# Patient Record
Sex: Male | Born: 1940 | Hispanic: No | Marital: Married
Health system: Northeastern US, Academic
[De-identification: ages and names within clinical notes are randomized; demographics above are authoritative.]

---

## 2019-07-15 IMAGING — CT CT CHEST WITH UROGRAM
3 of 9 series · 11 of 46 positions shown, 17 images · IV contrast (isovue)
Comparison: Comparison was made to the prior exam(s) within the last 12 months 
dated  02/22/2019 and 04/12/2019

******** ADDENDUM #1 ********/n 
ADDED CT CHEST TECHNIQUE: The chest was scanned from base of neck through the 
lung bases with 855cc of Isovue 300 injected intravenously on a high resolution 
low dose CT scanner.  Routine MPR and MIP 3D renderings were reconstructed on an 
independent workstation with concurrent physician supervision. 
REPORT ******** 
CT CHEST WITH UROGRAM, 07/15/2019 [DATE]: 
CLINICAL INDICATION: Bladder carcinoma. History of prostate carcinoma 1229. 
A search for DICOM formatted images was conducted for prior CT imaging studies 
completed at a non-affiliated media free facility.
TECHNIQUE: The abdomen and pelvis were scanned from lung bases through the 
pubic rami without and with 955cc's of Isovue 300 contrast injected 
intravenously on a high-resolution CT scanner using dose reduction techniques. 
Routine MPR and MIP 3D renderings were reconstructed on an independent 
workstation with concurrent physician supervision.

[Series 8: coronal · coronal · 0.65mm/px · 2 of 141 slices shown, 3 images]
[im 47/141  soft-tissue]
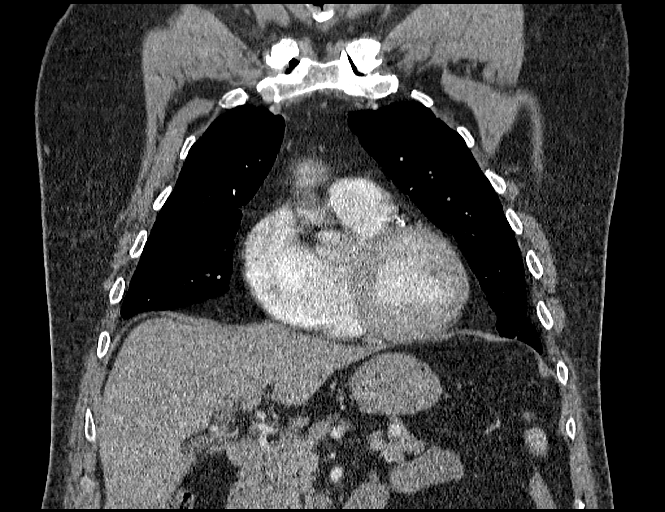
[im 47/141  bone]
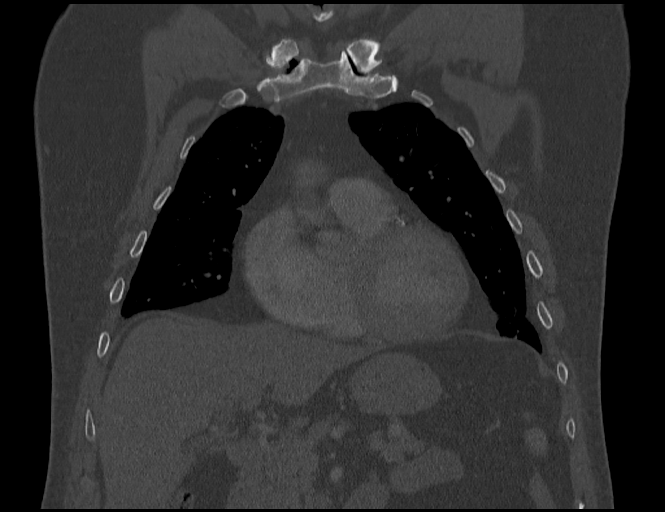
[im 94/141  soft-tissue]
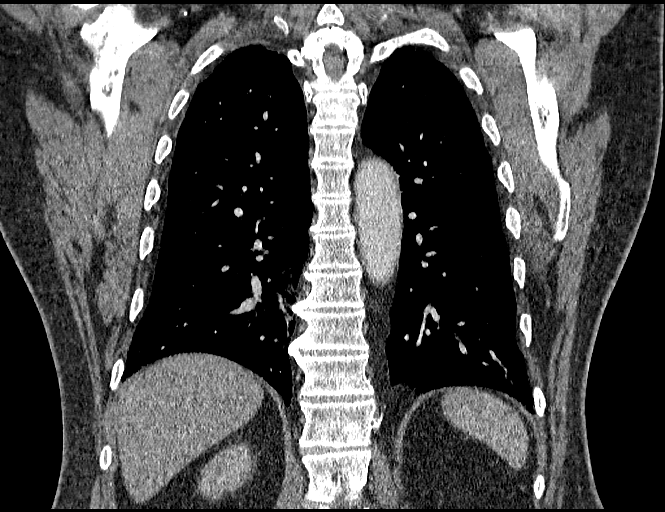

[Series 10: portal 3.0 i41s 2 · axial · portal-venous · 0.96mm/px · z∈[-593,-200]mm · 7 of 175 slices shown, 12 images]
[im 22/175  soft-tissue]
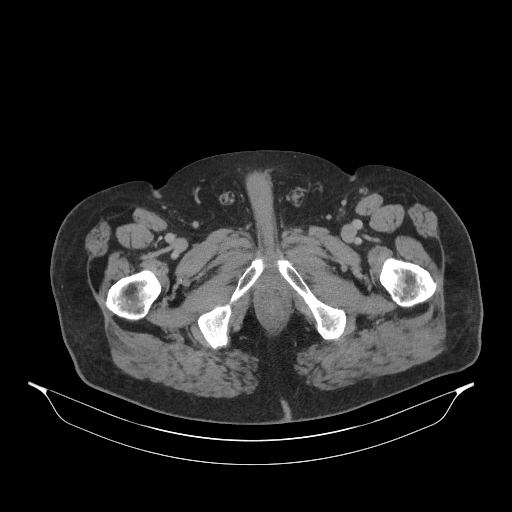
[im 22/175  bone]
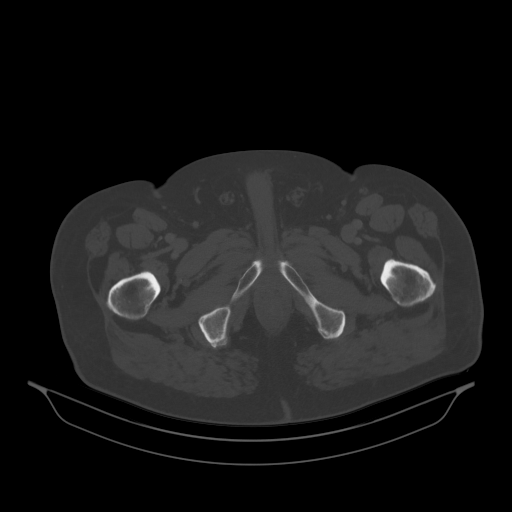
[im 44/175  soft-tissue]
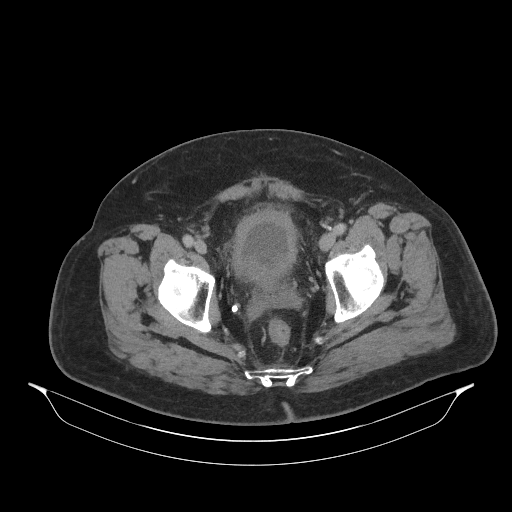
[im 66/175  soft-tissue]
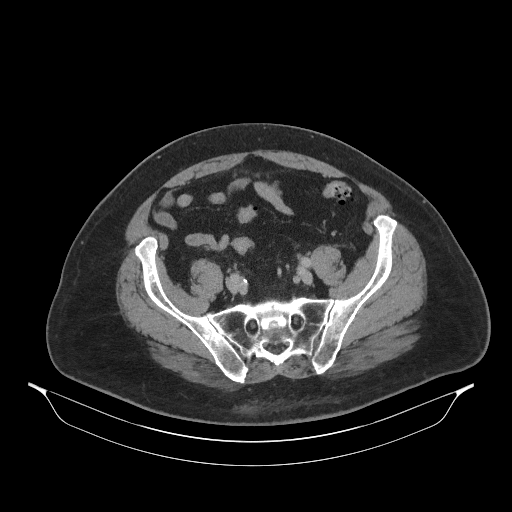
[im 88/175  soft-tissue]
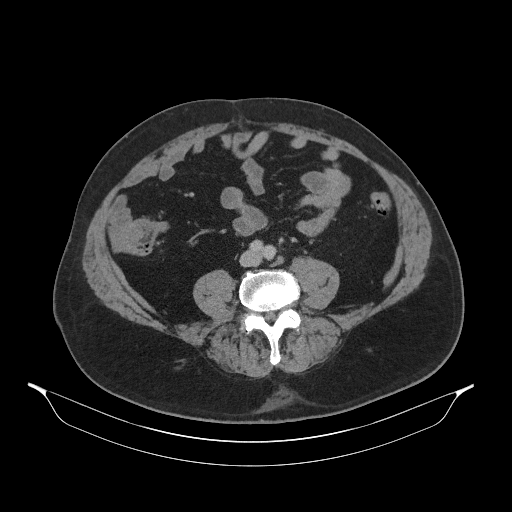
[im 88/175  lung]
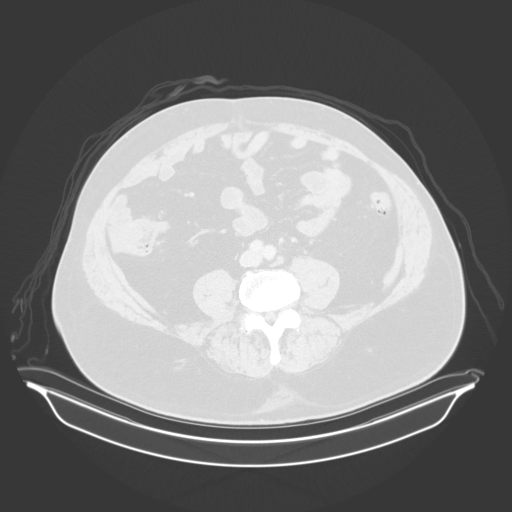
[im 109/175  soft-tissue]
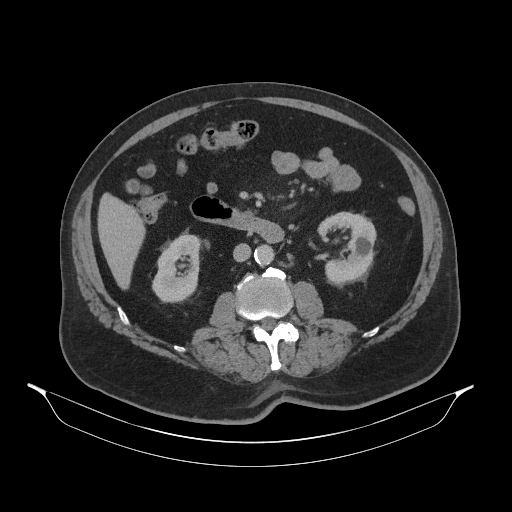
[im 109/175  lung]
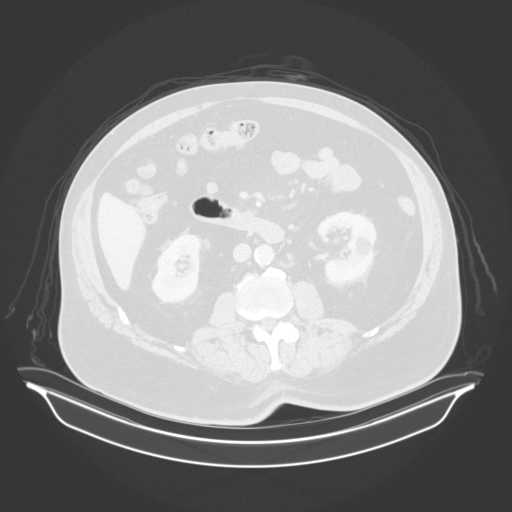
[im 131/175  soft-tissue]
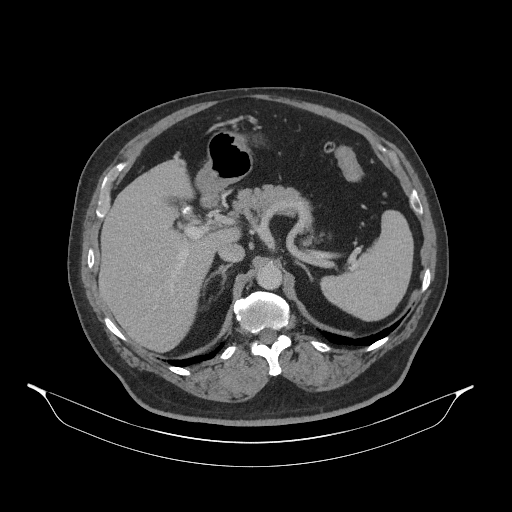
[im 131/175  lung]
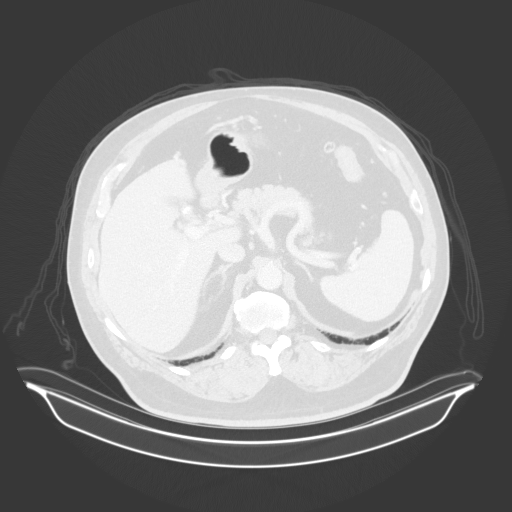
[im 153/175  soft-tissue]
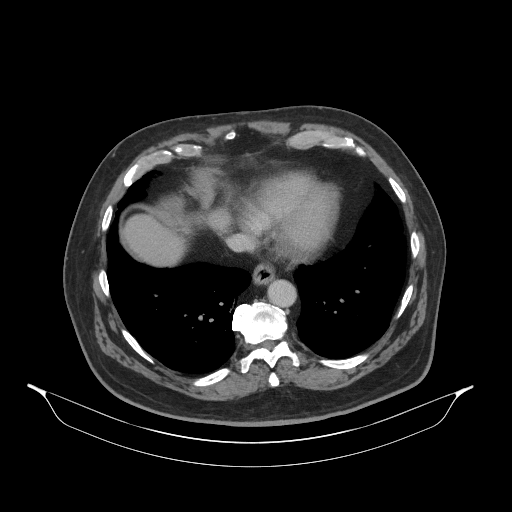
[im 153/175  lung]
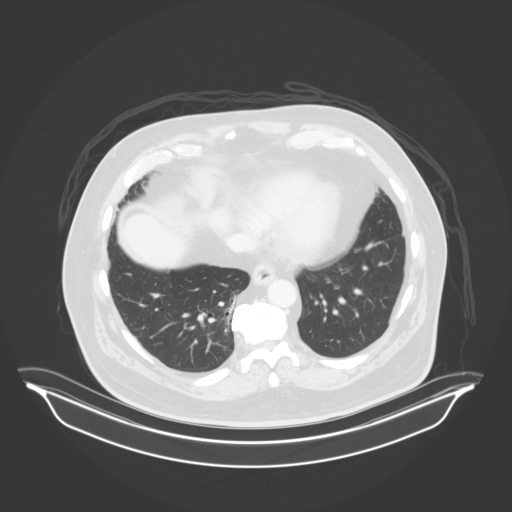

[Series 13: delay 3.0 i41s 2 · axial · delayed · 0.96mm/px · z∈[-593,-527]mm · 2 of 175 slices shown]
[im 22/175  soft-tissue]
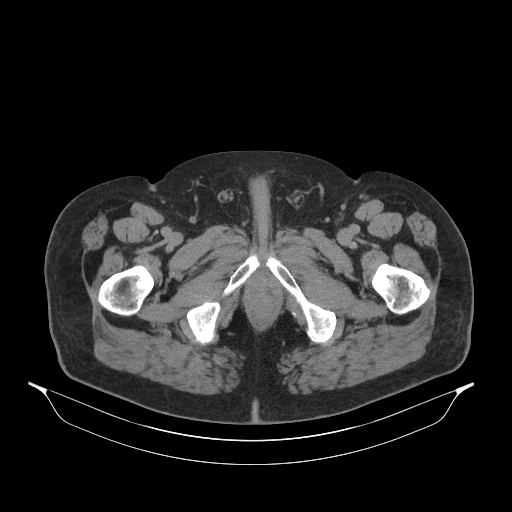
[im 44/175  soft-tissue]
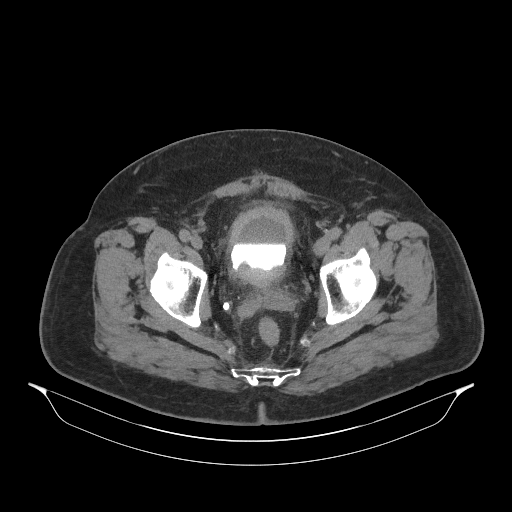

[11 of 46 positions shown; findings below may reference images not displayed]

FINDINGS: Atherosclerotic changes with marked coronary calcifications. 
Left-sided PICC line. Degenerative changes. No mediastinal mass or adenopathy. 
Pleural-based nodule along the major fissure best seen on image 85 actually 
decreased in conspicuity. Previously 12 mm currently 7 mm could represent 
incidental atelectasis or scarring actually improving as stated above. 
Osteophytosis within the lower thoracic spine with secondary adjacent fibrosis 
in the medial aspect right lower lobe similar to the prior exam. No pleural 
effusion. No new mass. Small hiatal hernia. Stable appearance of the chest. 
The liver, spleen, pancreas, adrenal glands and kidneys are normal in 
appearance. Stable nonobstructing 3 mm left renal calculus and simple cyst seen 
bilaterally. The gallbladder is contracted around numerous gallstones. No 
adenopathy. No free fluid seen within the abdomen. The retroperitoneal and 
pelvic lymph nodes described previously are not pathologically enlarged and are 
stable in appearance. Diverticulosis. Diffuse bladder wall thickening is once 
again identified with surrounding pericystic edema unchanged. Enlarged prostate 
gland elevates the bladder base. No osseous lesion seen.
IMPRESSION: Overall very stable appearance of the chest, abdomen and pelvis compared to 
prior exams. No new suspicious abnormality seen. 
RADIATION DOSE REDUCTION: All CT scans are performed using radiation dose 
reduction techniques, when applicable.  Technical factors are evaluated and 
adjusted to ensure appropriate moderation of exposure.  Automated dose 
management technology is applied to adjust the radiation doses to minimize 
exposure while achieving diagnostic quality images.

## 2020-05-15 ENCOUNTER — Inpatient Hospital Stay: Admit: 2020-05-15 | Discharge: 2020-05-19 | Payer: MEDICARE | Admitting: Surgery

## 2020-05-15 ENCOUNTER — Encounter: Admit: 2020-05-15 | Payer: PRIVATE HEALTH INSURANCE

## 2020-05-15 LAB — CBC WITH AUTO DIFFERENTIAL
BKR BLOOD UREA NITROGEN: 89.5 fL (ref 80.0–100.0)
BKR WAM ABSOLUTE IMMATURE GRANULOCYTES.: 0.02 x 1000/??L (ref 0.00–0.30)
BKR WAM ABSOLUTE LYMPHOCYTE COUNT.: 0.3 x 1000/??L — ABNORMAL LOW (ref 0.60–3.70)
BKR WAM ABSOLUTE NRBC (2 DEC): 0 x 1000/??L (ref 0.00–1.00)
BKR WAM ANALYZER ANC: 6.85 x 1000/??L (ref 2.00–7.60)
BKR WAM BASOPHIL ABSOLUTE COUNT.: 0.01 x 1000/??L (ref 0.00–1.00)
BKR WAM BASOPHILS: 0.1 % (ref 0.0–1.4)
BKR WAM EOSINOPHIL ABSOLUTE COUNT.: 0 x 1000/??L (ref 0.00–1.00)
BKR WAM EOSINOPHILS: 0 % (ref 0.0–5.0)
BKR WAM HEMATOCRIT (2 DEC): 23.1 % — ABNORMAL LOW (ref 38.50–50.00)
BKR WAM HEMOGLOBIN: 7.2 g/dL — ABNORMAL LOW (ref 13.2–17.1)
BKR WAM IMMATURE GRANULOCYTES: 0.3 % (ref 0.0–1.0)
BKR WAM LYMPHOCYTES: 3.8 % — ABNORMAL LOW (ref 17.0–50.0)
BKR WAM MCH (PG): 27.9 pg (ref 27.0–33.0)
BKR WAM MCHC: 31.2 g/dL — ABNORMAL LOW (ref 31.0–36.0)
BKR WAM MCV: 89.5 fL (ref 80.0–100.0)
BKR WAM MONOCYTE ABSOLUTE COUNT.: 0.78 x 1000/??L (ref 0.00–1.00)
BKR WAM MONOCYTES: 9.8 % (ref 4.0–12.0)
BKR WAM MPV: 9.1 fL (ref 8.0–12.0)
BKR WAM NEUTROPHILS: 86 % — ABNORMAL HIGH (ref 39.0–72.0)
BKR WAM NUCLEATED RED BLOOD CELLS: 0 % (ref 0.0–1.0)
BKR WAM PLATELETS: 212 x1000/??L (ref 150–420)
BKR WAM RED BLOOD CELL COUNT.: 2.58 M/ÂµL — ABNORMAL LOW (ref 4.00–6.00)
BKR WAM WHITE BLOOD CELL COUNT: 8 x1000/??L (ref 4.0–11.0)

## 2020-05-15 LAB — PROTIME AND INR
BKR INR: 1.06 (ref 0.87–1.14)
BKR PROTHROMBIN TIME: 11.5 seconds (ref 9.6–12.3)

## 2020-05-15 MED ORDER — ACETAMINOPHEN 325 MG TABLET
325 mg | Freq: Once | ORAL | Status: DC
Start: 2020-05-15 — End: 2020-05-16

## 2020-05-15 MED ORDER — LOSARTAN 50 MG TABLET
50 mg | Freq: Every day | ORAL | Status: DC
Start: 2020-05-15 — End: 2020-05-16

## 2020-05-15 MED ORDER — INSULIN LISPRO 100 UNIT/ML (SLIDING SCALE)
100 unit/mL | Freq: Every evening | SUBCUTANEOUS | Status: DC
Start: 2020-05-15 — End: 2020-05-19

## 2020-05-15 MED ORDER — DEXTROSE 50 % IN WATER (D50W) INTRAVENOUS SYRINGE
INTRAVENOUS | Status: DC | PRN
Start: 2020-05-15 — End: 2020-05-19

## 2020-05-15 MED ORDER — SKIM MILK
ORAL | Status: DC | PRN
Start: 2020-05-15 — End: 2020-05-19

## 2020-05-15 MED ORDER — FRUIT JUICE
ORAL | Status: DC | PRN
Start: 2020-05-15 — End: 2020-05-19

## 2020-05-15 MED ORDER — GLUCAGON 1 MG/ML IN STERILE WATER
Freq: Once | INTRAMUSCULAR | Status: DC | PRN
Start: 2020-05-15 — End: 2020-05-19

## 2020-05-15 MED ORDER — DEXTROSE 40 % ORAL GEL
40 % | ORAL | Status: DC | PRN
Start: 2020-05-15 — End: 2020-05-19

## 2020-05-15 MED ORDER — OXYCODONE IMMEDIATE RELEASE 5 MG TABLET
5 mg | ORAL | Status: DC | PRN
Start: 2020-05-15 — End: 2020-05-19
  Administered 2020-05-16: 01:00:00 5 mg via ORAL

## 2020-05-15 MED ORDER — INSULIN LISPRO 100 UNIT/ML (SLIDING SCALE)
100 unit/mL | Freq: Three times a day (TID) | SUBCUTANEOUS | Status: DC
Start: 2020-05-15 — End: 2020-05-19
  Administered 2020-05-18: 18:00:00 100 mL via SUBCUTANEOUS

## 2020-05-15 MED ORDER — ROSUVASTATIN 20 MG TABLET
20 mg | Freq: Every day | ORAL | Status: DC
Start: 2020-05-15 — End: 2020-05-19
  Administered 2020-05-16 – 2020-05-19 (×4): 20 mg via ORAL

## 2020-05-15 MED ORDER — POLYETHYLENE GLYCOL 3350 17 GRAM ORAL POWDER PACKET
17 gram | Freq: Every day | ORAL | Status: DC
Start: 2020-05-15 — End: 2020-05-19
  Administered 2020-05-17 – 2020-05-18 (×2): 17 gram via ORAL

## 2020-05-15 MED ORDER — LIDOCAINE 2 % MUCOSAL JELLY IN APPLICATOR
2 % | Freq: Four times a day (QID) | URETHRAL | Status: DC | PRN
Start: 2020-05-15 — End: 2020-05-19

## 2020-05-15 MED ORDER — AMLODIPINE 10 MG TABLET
10 mg | Freq: Every day | ORAL | Status: DC
Start: 2020-05-15 — End: 2020-05-16

## 2020-05-15 MED ORDER — ACETAMINOPHEN 325 MG TABLET
325 mg | Freq: Four times a day (QID) | ORAL | Status: DC
Start: 2020-05-15 — End: 2020-05-19
  Administered 2020-05-16 – 2020-05-19 (×13): 325 mg via ORAL

## 2020-05-15 MED ORDER — SODIUM CHLORIDE 0.9 % INTRAVENOUS SOLUTION
INTRAVENOUS | Status: DC
Start: 2020-05-15 — End: 2020-05-19
  Administered 2020-05-16 (×2): via INTRAVENOUS

## 2020-05-15 MED ORDER — HEPARIN (PORCINE) 5,000 UNIT/ML INJECTION SOLUTION
5000 unit/mL | Freq: Two times a day (BID) | SUBCUTANEOUS | Status: DC
Start: 2020-05-15 — End: 2020-05-19
  Administered 2020-05-16 – 2020-05-17 (×2): via SUBCUTANEOUS

## 2020-05-15 MED ORDER — SODIUM CHLORIDE 0.9 % (FLUSH) INJECTION SYRINGE
0.9 % | Freq: Three times a day (TID) | INTRAVENOUS | Status: DC
Start: 2020-05-15 — End: 2020-05-19
  Administered 2020-05-16 – 2020-05-18 (×2): 0.9 mL via INTRAVENOUS

## 2020-05-15 MED ORDER — OXYCODONE IMMEDIATE RELEASE 5 MG TABLET
5 mg | ORAL | Status: DC | PRN
Start: 2020-05-15 — End: 2020-05-19
  Administered 2020-05-18 (×3): 5 mg via ORAL

## 2020-05-15 MED ORDER — SODIUM CHLORIDE 0.9 % (FLUSH) INJECTION SYRINGE
0.9 % | INTRAVENOUS | Status: DC | PRN
Start: 2020-05-15 — End: 2020-05-19

## 2020-05-16 ENCOUNTER — Ambulatory Visit: Admit: 2020-05-16 | Payer: PRIVATE HEALTH INSURANCE

## 2020-05-16 DIAGNOSIS — R319 Hematuria, unspecified: Secondary | ICD-10-CM

## 2020-05-16 LAB — BASIC METABOLIC PANEL
BKR ANION GAP: 13 (ref 7–17)
BKR ANION GAP: 13 g/dL — ABNORMAL LOW (ref 7–17)
BKR BLOOD UREA NITROGEN: 19 mg/dL (ref 8–23)
BKR BUN / CREAT RATIO: 16.2 % — ABNORMAL HIGH (ref 8.0–23.0)
BKR BUN / CREAT RATIO: 19.3 (ref 8.0–23.0)
BKR CALCIUM: 8.2 mg/dL — ABNORMAL LOW (ref 8.8–10.2)
BKR CALCIUM: 8.4 mg/dL — ABNORMAL LOW (ref 8.8–10.2)
BKR CHLORIDE: 103 mmol/L (ref 98–107)
BKR CHLORIDE: 99 mmol/L (ref 98–107)
BKR CO2: 19 mmol/L — ABNORMAL LOW (ref 20–30)
BKR CO2: 20 mmol/L (ref 20–30)
BKR CREATININE: 1.17 mg/dL (ref 0.40–1.30)
BKR CREATININE: 1.19 mg/dL (ref 0.40–1.30)
BKR EGFR (AFR AMER): >60 mL/min/{1.73_m2} (ref >60)
BKR EGFR (AFR AMER): >60 mL/min/{1.73_m2} (ref >60)
BKR EGFR (NON AFRICAN AMERICAN): 59 mL/min/{1.73_m2} (ref >60)
BKR GLUCOSE: 128 mg/dL — ABNORMAL HIGH (ref 70–100)
BKR GLUCOSE: 53 mg/dL — ABNORMAL LOW (ref 70–100)
BKR POTASSIUM: 3.9 mmol/L (ref 3.3–5.3)
BKR POTASSIUM: 4.3 mmol/L (ref 3.3–5.3)
BKR SODIUM: 131 mmol/L — ABNORMAL LOW (ref 136–144)
BKR SODIUM: 136 mmol/L (ref 136–144)
BKR WAM RDW-CV: 19.3 % — ABNORMAL HIGH (ref 8.0–23.0)

## 2020-05-16 LAB — CBC WITH AUTO DIFFERENTIAL
BKR EGFR (NON AFRICAN AMERICAN): 9.2 fL (ref 8.0–12.0)
BKR WAM ABSOLUTE IMMATURE GRANULOCYTES.: 0.02 x 1000/??L (ref 0.00–0.30)
BKR WAM ABSOLUTE LYMPHOCYTE COUNT.: 0.41 x 1000/??L — ABNORMAL LOW (ref 0.60–3.70)
BKR WAM ABSOLUTE NRBC (2 DEC): 0 x 1000/??L (ref 0.00–1.00)
BKR WAM ANALYZER ANC: 5.44 x 1000/??L (ref 2.00–7.60)
BKR WAM BASOPHIL ABSOLUTE COUNT.: 0 x 1000/??L (ref 0.00–1.00)
BKR WAM BASOPHILS: 0 % (ref 0.0–1.4)
BKR WAM EOSINOPHIL ABSOLUTE COUNT.: 0.01 x 1000/??L (ref 0.00–1.00)
BKR WAM EOSINOPHILS: 0.2 % (ref 0.0–5.0)
BKR WAM HEMATOCRIT (2 DEC): 23.2 % — ABNORMAL LOW (ref 38.50–50.00)
BKR WAM HEMOGLOBIN: 7.1 g/dL — ABNORMAL LOW (ref 13.2–17.1)
BKR WAM IMMATURE GRANULOCYTES: 0.3 % (ref 0.0–1.0)
BKR WAM LYMPHOCYTES: 6.2 % — ABNORMAL LOW (ref 17.0–50.0)
BKR WAM MCH (PG): 27 pg (ref 27.0–33.0)
BKR WAM MCHC: 30.6 g/dL — ABNORMAL LOW (ref 31.0–36.0)
BKR WAM MCV: 88.2 fL (ref 80.0–100.0)
BKR WAM MONOCYTE ABSOLUTE COUNT.: 0.69 x 1000/??L (ref 0.00–1.00)
BKR WAM MONOCYTES: 10.5 % (ref 4.0–12.0)
BKR WAM MPV: 9.2 fL (ref 8.0–12.0)
BKR WAM NEUTROPHILS: 82.8 % — ABNORMAL HIGH (ref 39.0–72.0)
BKR WAM NUCLEATED RED BLOOD CELLS: 0 % (ref 0.0–1.0)
BKR WAM PLATELETS: 218 x1000/??L (ref 150–420)
BKR WAM RDW-CV: 17.2 % — ABNORMAL HIGH (ref 11.0–15.0)
BKR WAM RED BLOOD CELL COUNT.: 2.63 M/??L — ABNORMAL LOW (ref 4.00–6.00)
BKR WAM WHITE BLOOD CELL COUNT: 6.6 x1000/ÂµL (ref 4.0–11.0)

## 2020-05-16 LAB — RETICULOCYTES
BKR WAM IRF: 29.8 % — ABNORMAL HIGH (ref 3.0–15.9)
BKR WAM RETICULOCYTE - ABS (3 DEC): 0.051 10??6 cells/uL (ref 0.023–0.140)
BKR WAM RETICULOCYTE COUNT PCT (1 DEC): 2 % (ref 0.6–2.7)
BKR WAM RETICULOCYTE HGB EQUIVALENT: 20 pg — ABNORMAL LOW (ref 28.2–35.7)

## 2020-05-16 LAB — HEMOGLOBIN AND HEMATOCRIT, BLOOD
BKR WAM HEMATOCRIT (2 DEC): 28.5 % — ABNORMAL LOW (ref 38.50–50.00)
BKR WAM HEMOGLOBIN: 9.5 g/dL — ABNORMAL LOW (ref 13.2–17.1)

## 2020-05-16 LAB — VITAMIN B12: BKR VITAMIN B12: 373 pg/mL (ref 232–1245)

## 2020-05-16 LAB — SARS COV-2 (COVID-19) RNA-~~LOC~~ LABS (BH GH LMW YH): BKR SARS-COV-2 RNA (COVID-19) (YH): NEGATIVE

## 2020-05-16 MED ORDER — IOHEXOL 350 MG IODINE/ML INTRAVENOUS SOLUTION
350 mg iodine/mL | Freq: Once | INTRAVENOUS | Status: CP | PRN
Start: 2020-05-16 — End: ?
  Administered 2020-05-16: 17:00:00 350 mL via INTRAVENOUS

## 2020-05-16 MED ORDER — ALPRAZOLAM 1 MG TABLET
1 mg | Freq: Three times a day (TID) | ORAL | Status: AC | PRN
Start: 2020-05-16 — End: ?

## 2020-05-16 MED ORDER — DILTIAZEM IMMEDIATE RELEASE 30 MG TABLET
30 mg | Freq: Four times a day (QID) | ORAL | Status: DC
Start: 2020-05-16 — End: 2020-05-19
  Administered 2020-05-16 – 2020-05-19 (×11): 30 mg via ORAL

## 2020-05-16 MED ORDER — OXYBUTYNIN CHLORIDE ER 5 MG TABLET,EXTENDED RELEASE 24 HR
5 mg | Freq: Every evening | ORAL | Status: DC
Start: 2020-05-16 — End: 2020-05-17
  Administered 2020-05-17: 03:00:00 5 mg via ORAL

## 2020-05-16 MED ORDER — BELLADONNA ALKALOIDS-OPIUM 16.2 MG-30 MG RECTAL SUPPOSITORY
16.2-30 mg | Freq: Two times a day (BID) | RECTAL | Status: DC | PRN
Start: 2020-05-16 — End: 2020-05-19
  Administered 2020-05-18 – 2020-05-19 (×2): via RECTAL

## 2020-05-16 MED ORDER — FERROUS SULFATE 325 MG (65 MG IRON) TABLET
325 mg (65 mg iron) | Freq: Every day | ORAL | Status: AC
Start: 2020-05-16 — End: ?

## 2020-05-16 MED ORDER — DILTIAZEM IMMEDIATE RELEASE 30 MG TABLET
30 mg | Freq: Four times a day (QID) | ORAL | Status: AC
Start: 2020-05-16 — End: ?

## 2020-05-16 MED ORDER — SODIUM CHLORIDE 0.9 % BOLUS (NEW BAG)
0.9 % | Freq: Once | INTRAVENOUS | Status: CP | PRN
Start: 2020-05-16 — End: ?
  Administered 2020-05-16: 17:00:00 0.9 mL/h via INTRAVENOUS

## 2020-05-16 MED ORDER — TROSPIUM 20 MG TABLET
20 mg | Freq: Two times a day (BID) | ORAL | Status: AC
Start: 2020-05-16 — End: ?

## 2020-05-16 MED ORDER — SENNOSIDES 8.6 MG-DOCUSATE SODIUM 50 MG TABLET
Freq: Every day | ORAL | Status: AC
Start: 2020-05-16 — End: ?

## 2020-05-16 MED ORDER — PANTOPRAZOLE 40 MG TABLET,DELAYED RELEASE
40 mg | Freq: Every day | ORAL | Status: DC
Start: 2020-05-16 — End: 2020-05-19
  Administered 2020-05-16 – 2020-05-19 (×4): 40 mg via ORAL

## 2020-05-16 MED ORDER — POLYETHYLENE GLYCOL 3350 17 GRAM ORAL POWDER PACKET
17 gram | Freq: Every day | ORAL | Status: AC
Start: 2020-05-16 — End: ?

## 2020-05-16 MED ORDER — OXYCODONE IMMEDIATE RELEASE 5 MG TABLET
5 mg | Freq: Four times a day (QID) | ORAL | Status: AC | PRN
Start: 2020-05-16 — End: ?

## 2020-05-16 MED ORDER — AMLODIPINE 5 MG TABLET
5 mg | Freq: Every day | ORAL | Status: DC
Start: 2020-05-16 — End: 2020-05-19
  Administered 2020-05-16 – 2020-05-19 (×4): 5 mg via ORAL

## 2020-05-16 MED ORDER — OXYBUTYNIN CHLORIDE ER 15 MG TABLET,EXTENDED RELEASE 24 HR
15 mg | Freq: Every day | ORAL | Status: AC
Start: 2020-05-16 — End: ?

## 2020-05-16 NOTE — Other
Urologic Surgery Consult NoteReason For Consult: gross hematuriaRequesting Attending: Cruz Condon, MDUrology Attending: Dr. Casimiro Needle SievHPI:  Max Snyder is a 79 y.o. male w/ PMH of atrial fibrillation (previously on Eliquis, held), CAD s/p stents, recent DVT/pulmonary embolism (s/p IVC filter placement on 12/9), T2DM, HTN, Lyme disease, CVA, EtOH use disorder and Stage 2 NMIBC (prostatic involvement) followed by Dr. Carles Collet and Dr. Vassie Loll s/p TURBTs and radiation therapy complicated by refractory radiation cystitis s/p hyperbaric oxygen, multiple clot evacuations/fulgurations, intravesical therapies (Amicar, Formalin 2%) and now with s/p bilateral nephrostomy tube placement on 04/19/20 with ongoing hematuria from his nephrostomy tubes and bladder. He was most recently discharged from [facility] on 12/21 to home with frequent monitoring of his blood counts after extensive goals of care discussion involving palliative care. He represented to [facility] ED with worsening hematuria from his nephrostomy tubes and critically low hemoglobin/hematocrit requiring blood transfusions. He was transferred to [facility] for further evaluation. On evaluation, the patient is afebrile and hemodynamically stable. The patient's labs are notable for anemia, Hgb 7.2 (s/p 2u pRBC transfusion at OSH, per report). Prior to transfer from [facility] ED, he was reported to be tachycardic to 110-120s. He was transferred 2U of packed red blood cells at outside hospital. He reports that he was discharged home but had continued bleeding of clots from his meatus with associated discomfort and bleeding within the nephrostomy tubes. He had serial lab evaluations and was instructed to present to the ED for possible blood transfusion. He denies producing any urine from his bladder other than passing clots. He denies fevers or chills, endorses an episode of nausea and vomiting. He denies bilateral flank pain. He denies chest pain or lightheadedness but endorses shortness of breath. He has good understanding of his disease and is aware that the nephrostomy tubes are permanent in order to divert urine and allow his bladder to clot off. He also reports a prior renal angiogram to evaluate for source of hematuria from his nephrostomy tubes which was negative per report. He also reports an intra-operative cystogram which demonstrated left sided reflux and that his ureters were purposefully scarred off. Per review of available information on Care Everywhere, the patient was most recently admitted to [facility] (12/1 to 12/21) with medically refractory gross hematuria. Prior to this admission, he has undergone hyperbaric oxygen, Amicar instillations and 2% formalin instillations on 03/02/20. He previously met with Dr. Francoise Schaumann due to TCC invasion into prostatic stroma where he was reluctant to consider radical cystectomy. Urology was consulted while he was admitted and he underwent multiple interventions including clot evacuations, intravesical therapies and ultimately bilateral nephrostomy tube placement with worsening hematuria from his nephrostomy tubes s/p interval upsizing of his nephrostomy tubes most recently on December 16th. He was also found to have a subsegmental PE with DVT ultrasound demonstrating bilateral below the knee DVTs and underwent IVC filter placement on 12/9 given concern for bleeding on anticoagulation.  His hospitalization was also notable for atrial fibrillation with RVR for which he was rate controlled with diltiazem. Palliative care was involved in his goals of care decision making for medically refractory gross hematuria. Based on goals of care discussions, he was ultimately discharged to home with frequent monitoring of his blood counts. ?PMH PSH Past Medical History: Diagnosis Date ? 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 Bladder cancer (HC Code)  ? Diabetes mellitus (HC Code) Type 2 ? Hypertension  ? Kidney stone  ? Lyme disease  ? Stroke [facility] Code) 2004  No past surgical history on file. Social History Family History Social History Tobacco Use ? Smoking status: Never Smoker ? Smokeless tobacco: Never Used Substance Use Topics ? Alcohol use: Yes  No family history on file. Current Medications No current facility-administered medications for this encounter.Current Outpatient Medications: ?  amLODIPine (NORVASC) 10 mg tablet, , Disp: , Rfl: ?  aspirin 81 mg EC delayed release tablet, Take 81 mg by mouth., Disp: , Rfl: ?  atorvastatin (LIPITOR) 80 mg tablet, atorvastatin 80 mg tablet, Disp: , Rfl: ?  glimepiride (AMARYL) 2 mg tablet, , Disp: , Rfl: ?  losartan (COZAAR) 100 mg tablet, , Disp: , Rfl: ?  pantoprazole (PROTONIX) 40 mg tablet, , Disp: , Rfl: ?  tadalafiL (CIALIS) 5 mg tablet, daily., Disp: , Rfl:   Allergies Lisinopril and Sulfa (sulfonamide antibiotics)  Review of Systems: A 10 point review of system was conducted that was negative unless noted in the HPI. Objective: Vitals: Temp:  [97.8 ?F (36.6 ?C)] 97.8 ?F (36.6 ?C)Pulse:  [100] 100BP: (121)/(65) 121/65SpO2:  [96 %] 96 %Device (Oxygen Therapy): room airIntake/Output: No intake/output data recorded. PHYSICAL EXAMPatient exam or treatment required medical chaperone.The sensitive parts of the examination were performed with chaperone present: Yes; Chaperone Name, Role/Title: Dr. Sharon Seller Gen: well appearing, resting in bed in no acute distressCV: regular ratePulm: comfortable on room airAbd: soft, protuberant, non-tender to palpation. Bilateral nephrostomy tubes draining yellow urine with debriGU: small dried old clots per urethral meatus visualized in incontinence diaper  Ext: warm, well perfused LABS/RADIOLOGY:CBCLab Results Component Value Date  WBC 8.0 05/15/2020  HGB 7.2 (L) 05/15/2020  HCT 23.10 (L) 05/15/2020  PLT 212 05/15/2020 ELECTROLYTESNo results found for: NA, K, CL, CO2, CREATININE, BUN, GLU, CALCIUM, MG, PHOSURINALYSISNo results found for: UA, LABURINIMAGINGNo results found.Assessment/Recommendations  Max Snyder is a 79 y.o. male, with multiple medical co-morbidities including but not limited to atrial fibrillation with RVR (previously on Eliquis currently held), CAD s/p stents and recent DVT/PE (s/p IVC filter placement on 12/9) and stage T2 NMIBC (prostatic involvement) bladder cancer s/p TURBTs and radiation therapy complicated by refractory radiation cystitis s/p HBO, multiple clot evacuations/fulgurations, intravesical therapies (Amicar, Formalin 2%) and now s/p bilateral nephrostomy tube placement on 12/2 with ongoing hematuria from his nephrostomy tubes and bladder. He was most recently discharged from [facility] on 12/21 to home with frequent monitoring of his blood counts after extensive goals of care discussion involving palliative care. He represented to [facility] ED with worsening hematuria from his nephrostomy tubes and critically low hemoglobin/hematocrit requiring blood transfusions. On physical examination, both of his nephrostomy tubes draining yellow urine with debris along with small clots per his urethral meatus. Plan is to maintain maximal urinary diversion with bilateral nephrostomy tubes to allow bladder to clot off and self-tamponade any lower tract bleeding. Admit to Urology under Dr. Santa Genera Acute GU InterventionCan consider increased formalin concentration instillation on an outpatient basisBilateral nephrostomy tubes to gravity drainageLidocaine jelly to urethral meatus PRN for discomfort Monitor urine outputTrend H+H, transfuse PRNDiscussed with Dr. Luther Parody:Chrystine Oiler, MD // Lucretia Kern, MDUrology ResidentConsult Pager: [phone] from 6am-6pm. Amion for nights/weekends.

## 2020-05-16 NOTE — Consults
[facility] Interventional RadiologyConsultation Information Interventional Radiology consultation requested by: Max Snyder, MDReason for consultation: Patient with history of hemorrhagic cystitis after radiation of bladder cancer, bilateral nephrostomy tube placements, persistent hematuria despite bilateral ureteral occlusion devices, persistent bladder clots.Source of information: Patient, medical record, and consulting providerHistory of Present Illness Max Snyder is a 79 y.o. male with hemorrhagic cystitis s/p bladder cancer irradiation.Interventional radiology was requested to evaluate for possible bladder embolization to treat hemorrhagic cystitis. Past Medical History Past Medical History: Diagnosis Date ? 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 Bladder cancer (HC Code)  ? Diabetes mellitus (HC Code)   Type 2 ? Hypertension  ? Kidney stone  ? Lyme disease  ? Stroke [facility] Code) 2004  Past Surgical History No past surgical history on file. Family History No family history on file. Social History Social History Tobacco Use ? Smoking status: Never Smoker ? Smokeless tobacco: Never Used Substance Use Topics ? Alcohol use: Yes  He reports no history of drug use.  Inpatient Medications Outpatient Medications Current Facility-Administered Medications Medication Dose Route Frequency Provider Last Rate Last Admin ? acetaminophen (TYLENOL) tablet 975 mg  975 mg Oral Q6H Max Kern, MD   975 mg at 05/16/20 1246 ? amLODIPine (NORVASC) tablet 5 mg  5 mg Oral Daily Max Kaufmann, MD   5 mg at 05/16/20 1020 ? dilTIAZem (CARDIZEM) Immediate Release tablet 30 mg  30 mg Oral Q6H Max Kaufmann, MD   30 mg at 05/16/20 1246 ? heparin (porcine) injection 5,000 Units  5,000 Units Subcutaneous Q12H Max Kern, MD   5,000 Units at 05/16/20 0930 ? insulin lispro (Admelog, HumaLOG) Sliding Scale (See admin instructions for dose)   Subcutaneous TID AC Max, Judeth Cornfield, MD     ? insulin lispro (Admelog, HumaLOG) Sliding Scale (See admin instructions for dose)   Subcutaneous Nightly Max, Judeth Cornfield, MD     ? oxybutynin XL (DITROPAN-XL) 24 hr tablet 15 mg  15 mg Oral Nightly Snyder, Max Ushir, MD     ? pantoprazole (PROTONIX) EC tablet 40 mg  40 mg Oral Daily Max Kaufmann, MD   40 mg at 05/16/20 0931 ? polyethylene glycol (MIRALAX) packet 17 g  17 g Oral Daily Max, Judeth Cornfield, MD     ? rosuvastatin (CRESTOR) tablet 40 mg  40 mg Oral Daily Max Kern, MD   40 mg at 05/16/20 0931 ? sodium chloride 0.9 % flush 3 mL  3 mL IV Push Q8H Max, Judeth Cornfield, MD   3 mL at 05/15/20 2132 ? sodium chloride 100 mL/hr (05/16/20 1540) belladonna alkaloids-opium, dextrose (GLUCOSE) 40 % gel 15 g **OR** fruit juice **OR** skim milk, dextrose (GLUCOSE) 40 % gel 30 g **OR** fruit juice, dextrose 50% in water (D50W), dextrose 50% in water (D50W), glucagon, lidocaine uro-jet, oxyCODONE, oxyCODONE, sodium chloride Medications Prior to Admission Medication Sig Dispense Refill Last Dose ? ALPRAZolam (XANAX) 1 mg tablet Take 1 mg by mouth 3 (three) times daily as needed for anxiety.    ? amLODIPine (NORVASC) 10 mg tablet Take 10 mg by mouth daily.     ? atorvastatin (LIPITOR) 80 mg tablet Take 80 mg by mouth at bedtime.     ? dilTIAZem (CARDIZEM) 30 mg Immediate Release tablet Take 30 mg by mouth 4 (four) times daily.    ? ferrous sulfate (FEOSOL) 325 mg (65 mg iron) tablet Take 1 tablet by mouth daily.    ? glimepiride (AMARYL) 2 mg tablet Take 2 mg by mouth  every morning.     ? oxybutynin XL (DITROPAN XL) 15 mg 24 hr tablet Take 15 mg by mouth daily.    ? oxyCODONE (ROXICODONE) 5 mg Immediate Release tablet Take 5 mg by mouth every 6 (six) hours as needed.    ? pantoprazole (PROTONIX) 40 mg tablet Take 40 mg by mouth daily.     ? polyethylene glycol (MIRALAX) 17 gram packet Take 17 g by mouth daily. Mix in 8 ounces of water, juice, soda, coffee or tea prior to taking.    ? senna-docusate (SENNA-PLUS) 8.6-50 mg tablet Take 1 tablet by mouth daily.    ? tadalafiL (CIALIS) 5 mg tablet Take 5 mg by mouth daily.     ? trospium (SANCTURA) 20 mg tablet Take 20 mg by mouth 2 (two) times daily.    ? aspirin 81 mg EC delayed release tablet Take 81 mg by mouth. (Patient not taking: Reported on 05/16/2020)   Not Taking at Unknown time ? losartan (COZAAR) 100 mg tablet  (Patient not taking: Reported on 05/16/2020)   Not Taking at Unknown time  Allergies Allergies Allergen Reactions ? Lisinopril Other (See Comments) ? Sulfa (Sulfonamide Antibiotics) Other (See Comments)  Objective Data Physical ExamVitals:  05/16/20 1218 05/16/20 1253 05/16/20 1309 05/16/20 1538 BP: 128/68 126/70 119/68 (!) 151/68 Pulse: (!) 109 (!) 108 (!) 105 (!) 103 Resp: 20 18 18 20  Temp: 98.3 ?F (36.8 ?C) 98.1 ?F (36.7 ?C) 97.9 ?F (36.6 ?C) 98.1 ?F (36.7 ?C) TempSrc: Oral Oral Oral  SpO2: 99% 99% 98% 99% Weight:     Height:     Neuro: Awake and alert in no acute distressHeart: Warm, perfusedLungs: Comfortably conversantAbdomen: Non-distendedLaboratory ResultsChemistry:Recent Labs Lab 12/28/211800 12/29/210509 NA 131* 136 K 4.3 3.9 CL 99 103 CO2 19* 20 BUN 23 19 CREATININE 1.19 1.17 Complete Blood Count:Recent Labs Lab 12/28/211800 12/29/210509 WBC 8.0 6.6 HGB 7.2* 7.1* HCT 23.10* 23.20* PLT 212 218 Liver Function Tests:No results for input(s): AST, ALT, ALKPHOS, BILITOT in the last 168 hours.Coagulation Studies:Recent Labs Lab 12/28/211800 LABPROT 11.5 INR 1.06 Microbiology:No results for input(s): LABBLOO, LABURIN, LOWERRESPIRA in the last 168 hours.Pertinent ImagingAssessment and Plan AssessmentJames Snyder is a 79 y.o. male with hemorrhagic cystitis s/p bladder cancer irradiation, persistent nephrostomy-tube bleeding despite bilateral ureteral occlusion devices, for which consultation for possible bladder embolization was requested.  After comprehensive evaluation of the patient and discussion with the requesting service, Interventional Radiology recommends discussion with patient for bladder embolization.PlanAfter extensive discussion of risks and benefits of bladder embolization, it was our opinion that the patient would benefit from this procedure. However, given the longstanding multiple treatments and interventions this patient has had, he is unsure about going through with this procedure given the risks of nontarget embolization and treatment failure.Will followup early AM tomorrow regarding opinion. Please page interventional radiology regarding patient's definitive decision if he decides. However, please proceed with COVID testing, appropriate INR (<1.5) platelets >50,000, and NPO at midnight as though procedure were being performed. Thank you for involving Interventional Radiology in the care of your patient. Please text or call my Mobile Heart Beat with any questions or concerns.With urgent questions or concerns, please contact IR IH:[id number] (pagers)- YSC: [phone]- SRC: [phone]- BH: [phone] (phone, all locations)- [phone] Noreene Larsson Meredith Pel, MD 05/16/2020

## 2020-05-16 NOTE — ED Notes
9:45 PM Floor Handoff Telemetry: 	[]  Yes		[x]  NoCode Status:   [x]  Full		[]  DNR		[]  DNI		Other (specify):Safety Precautions: [x]  None	[]  Sitter   []  Restraints	[]  Suicidal	[]  Fall Risk	Other (specify):Mentation/Orientation:	 A&O (Self, person, place, time) x  4        	 Disoriented to:                    	 Deficits: []  Hearing impaired	[]  Blind  []  Nonverbal	 []  Mental retardationOxygenation Upon Admission: [x]  RA	[]  NC	[]  Venti  []  Simple Mask []  Other	Baseline O2 Status? []  Yes	[]  NoAmbulation: []  Independent	[]  Cane   []  Walker	[]  Wheelchair	[]  Bedbound		[]  Hemiplegic	[]  Paraplegic	[]  QuadraplegicEliminiation: [x]  Independent	[]  Commode	[x]  Bedpan/Urinal  []  Straight Cath []  Foley cath			[]  Urostomy	[]  Colostomy	Other (specify): Diarrhea/Loose stool : []  1x within 24h  []  2x within 24h  []  3x within 24h  [x]  None 	C.Diff Order: 	[]  Ordered- needs to be collected             []  Collected-sent to lab             []  Resulted - Negative C.Diff             []  Resulted - Positive C.Diff[]  Not Ordered   [x]  N/ASkin Alteration: []  Pressure Injury []  Wound []  None [x]  Skin not assessedDiet: [x]  Regular/No order placed	[]  NPO		Other (specify):IV Access: [x]  PIV   []  PICC    []  Port    [] Central line    []  A-line    Other (specify)IVF/GTT Running Upon ED Departure? [x]  No	    []  Yes (specify):Outstanding Meds/Treatments/Tests:Patient Belongings:Are the belongings documented?          [x]  No	    []  YesIs someone taking belongings home?   [x]  No     []  Yes  Who? (specify)                                   ED RN and Contact number/MHB #:    Timothy Lasso RN

## 2020-05-16 NOTE — ED Notes
6:18 PM Patient is a 79 yo male with a hx of afib, DVT/PE (currenlty not on anticoagulants), and bilateral nephrostomy tubes s/p hemorraghic cystitis secondary to radiation treatment for bladder cancer BIBA with a medical problem. Pt reports blood with clots from his urethral meatus, and blood in his bilateral nephrostomy tubes since his surgery on 12/2. Pt went [place] an [place] in [place] today for continued bleeding, increased weakness and dizziness. Upon evaluation pt was found to be anemic, tachycardic, and pale. Pt received 2 units of PRBC's at the prior hospital with improvement. Pt arrives A+Ox4, no acute distress noted. Pt denies fevers, chills, CP, SOB, flank pain, or n/v/d. Urology at bedside.

## 2020-05-16 NOTE — ED Notes
7:15 PM Verbal report received from off-going RN. This RN assumed care of PT at this time. Hx bladder CA. Peeing clots from urethra. Came in for low H&H, dizziness weakness, received 2 units pta. AOx4. Was seen by urology.

## 2020-05-16 NOTE — ED Notes
7:31 PM Verbal report received from off-going RN. This RN assumed care of PT at this time. Pt with hx of bladder cancer. Pt with bilateral nephrostomy tubes. Arrives low H&H received 2 units of RBC at previous hospital. Per urology note plan to trend H&H. PT in NAD at this time. Respirations even and unlabored. 9:55 PM PT reports pain improved at this time. Pending transport to IP bed. PT AAox4.  PT BP 101/59- no compltaints at this time. MD Hanch[place] notified. 10:06 PM BP re checked and 110/57. MD notified. Pt to IP bed at this time. Stable upon departure from ED.

## 2020-05-16 NOTE — Other
-  CONSULT  REQUEST  DOCUMENTATION-[facility] NOTE-Type of consult: [facility] Interventional Radiology -New Consult: [id number] Max Snyder /Location: 15230/15230-A / *Brief Clinical Question: 71 M pmhx bladder cancer s/p chemorad, recent DVT/PE with IVC filter , afib(AC held),  radiation cystitis s/p hyperbaric O2and formalin now b/l NT and continued hematuria from b/l NT requiring trx-> evaluate for possible bladder embolization/Callback Cell Phone: [phone]** / Please confirm receipt of this message by texting back ?OK?-2 [place] Page sent to Interventional Radiology [facility] [phone] at 4:14 PM.-Araeya Lamb Hudd12/29/20214:13 [facility] [phone]

## 2020-05-16 NOTE — Other
-  CONSULT  REQUEST  DOC[facility]ATION-[facility] NOTE-Type of consult: [facility] Hematology -New Consult: [id number] Park Liter /Location: 15230/15230-A / *Brief Clinical Question: 73 M pmhx bladder cancer s/p chemorad, recent DVT/PE with IVC filter placed, afib with eliquis held,  radiation cystitis s/p hyperbaric oxygen and formalin now wiht b/l NT and continued hematuria from b/l nephrostomy tubes -> ongoing bleeding evaluation/Callback Cell Phone: [phone]** / Please confirm receipt of this message by texting back ?OK?-1 - Mobile Heartbeat message sent to [facility] at 4:12 PM. Received response at 1613.Casimiro Needle Hudd12/29/20214:12 [employer] [phone]

## 2020-05-16 NOTE — Utilization Review (ED)
UM Status: Meets Observation Status , hx bladder ca, bilateral nephrostomy tubes, transferred from OSH ED for further evaluation of hematuria, Urology evaluated, no acute intervention at this time, trend H+H. Dr. Avie Arenas and Dr. Jomarie Longs, Physician Advisor, notified of condition code 62 and did not disagree.

## 2020-05-16 NOTE — ED Provider Notes
Emergency Medicine Resident Note----------------------------------------------------------------------------17M, PMH includes bladder cancer (prostate involvement, prior TURBT and radiation therapy now w/refractory hemorrhagic radiation cystitis and subsequent b/l nephrostomy tubes placed for this reason) transferred from OSH following large volume hematuria from nephrostomy tubes necessitating transfusion of 2u PRBCs. At time of evaluation pt without complaints. Feels improved after transfusion. No fever or infectious symptoms. O/E no distress. Vitals broadly within acceptable limits. No increase in work of breathing. Abdo SNT. Nephrostomy tubes with urine and debris (no macroscopic hematuria). GCS 15.Chart reviewed: seems has had extensive medical and surgical attempts to control this refractory hematuria, with involvement from Palliative Care, c/b recent DVT (IVC filter placed). Hb 7.2 in our ED. Given apparent large volume bleeding requiring transfusion, seen by and admitted to Urology for further monitoring/management.Alex Fleming-NouriResident Emergency PhysicianAvailable via MHB----------------------------------------------------------------------------HistoryChief Complaint Patient presents with ? Medical Problem   [facility] bleeding nephrostomy tubes, received 2 units pRBCs prior to arrival, pt to be evaluated prior to admission, pt awake and alert on arrival VSS  OtherThis is a chronic problem. The current episode started more than 1 week ago. The problem occurs constantly. The problem has been gradually worsening. Nothing relieves the symptoms.  Past Medical History: Diagnosis Date ? 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 Bladder cancer (HC Code)  ? Diabetes mellitus (HC Code)   Type 2 ? Hypertension  ? Kidney stone  ? Lyme disease  ? Stroke (HC Code) 2004 No past surgical history on file.No family history on file.Social History Socioeconomic History ? Marital status: Married   Spouse name: Not on file ? Number of children: Not on file ? Years of education: Not on file ? Highest education level: Not on file Tobacco Use ? Smoking status: Never Smoker ? Smokeless tobacco: Never Used Vaping Use ? Vaping Use: Never used Substance and Sexual Activity ? Alcohol use: Yes ? Drug use: Never ? Sexual activity: Yes ED Other Social History ? E-cigarette status Never User  ? E-Cigarette Use Never User  E-cigarette/Vaping Substances E-cigarette/Vaping Devices Review of Systems All other systems reviewed and are negative. Physical ExamED Triage Vitals [05/15/20 1729]BP: 121/65Pulse: (!) 100Pulse from  O2 sat: n/aResp: n/aTemp: 97.8 ?F (36.6 ?C)Temp src: TemporalSpO2: 96 % BP (!) 151/68  - Pulse (!) 103  - Temp 98.1 ?F (36.7 ?C)  - Resp 20  - Ht 5' 11 (1.803 m)  - Wt 86.6 kg  - SpO2 99%  - BMI 26.64 kg/m? Physical ExamConstitutional:     General: He is not in acute distress.HENT:    Head: Normocephalic. Eyes:    General: Lids are normal. Cardiovascular:    Rate and Rhythm: Normal rate and regular rhythm. Pulmonary:    Effort: Pulmonary effort is normal. No respiratory distress. Abdominal:    General: There is no distension. Musculoskeletal:       General: Normal range of motion.    Cervical back: Normal range of motion. Skin:   General: Skin is warm.    Capillary Refill: Capillary refill takes less than 2 seconds. Neurological:    Mental Status: He is alert and oriented to person, place, and time.  ProceduresProcedures ED COURSEPatient Reevaluation: Attending Supervised: ResidentI saw and examined the patient. I agree with the findings and plan of care as documented in the resident's note. Of note Seanmichael Salmons Sangal16 year old male history as above presenting with bleeding approximate ooze.  Patient states he was feeling tired and blood work showed anemia for which she received 2  units packed red blood cells and transferred to this hospital.  He denies headache lightheadedness chest pain shortness breath abdominal pain.  Per report patient has facial color improved after blood transfusions.  On exam well-appearing no shift speaking full sentences no respiratory distress abdomen soft nontender bilateral nephrostomy to clean dry intact neurologic intact.  Differential includes hematuria secondary to infection postop complication obstruction underlying malignancy plan for urology consultation.Clinical Impressions as of May 17 1755 Hematuria  ED DispositionAdmit Melene Plan, MDResident12/29/21 1240 Quantarius Genrich, MD12/29/21 1756

## 2020-05-16 NOTE — Plan of Care
Plan of Care Overview/ Patient Status   2230-0700Pt arrive from the ED in stable condition. A/O X4, VSS on room air. Bi-lateral nephro tubes in place, both gravity bags replaced. Both nephro tubes draining bloody drainage. Urethral meatus with bloody/clot discharge. Port accessed yesterday at outside hospital, with + blood return but re-dressed. IVF infusing as ordered. Good PO intake. Continue to mon.

## 2020-05-16 NOTE — Progress Notes
Urologic Surgery Progress NoteAttending Provider: Michelene Gardener, MDAdmit Date: 12/28/2021Hospital Day: 2Code status: Full Code/ACLSAllergy: Lisinopril and Sulfa (sulfonamide antibiotics)Background:  Max Snyder is a 79 y.o. male w/ PMH of atrial fibrillation (previously on Eliquis, held), CAD s/p stents, recent DVT/pulmonary embolism (s/p IVC filter placement on 12/9), T2DM, HTN, Lyme disease, CVA, EtOH use disorder and Stage 2 NMIBC (prostatic involvement) followed by Dr. Carles Collet and Dr. Vassie Loll s/p TURBTs and radiation therapy complicated by refractory radiation cystitis s/p hyperbaric oxygen, multiple clot evacuations/fulgurations, intravesical therapies (Amicar, Formalin 2%) and now with s/p bilateral nephrostomy tube placement on 04/19/20 with ongoing hematuria from his nephrostomy tubes and bladderSubjective/Interim Events: - Low grade temperature to 100.35F this morning- Hg/Hct: 7.1/23.2 (7.2/23.1) from Hg 5.4/17.7 s/p 2U pRBC- Left nephrostomy tube with dark red urine, right nephrostomy tube with yellow urine- Clots per urethral meatus Objective: Temp:  [97.7 ?F (36.5 ?C)-100.3 ?F (37.9 ?C)] 100.3 ?F (37.9 ?C)Pulse:  [84-116] 92Resp:  [17-18] 18BP: (101-126)/(57-65) 126/63SpO2:  [96 %-100 %] 97 %Device (Oxygen Therapy): room airI/O last 3 completed shifts:In: 530 [I.V.:530]Out: 1475 [Urine:1475]  CBCLab Results Component Value Date  WBC 6.6 05/16/2020  HGB 7.1 (L) 05/16/2020  HCT 23.20 (L) 05/16/2020  PLT 218 05/16/2020 ELECTROLYTESLab Results Component Value Date  NA 136 05/16/2020  K 3.9 05/16/2020  CL 103 05/16/2020  CO2 20 05/16/2020  CREATININE 1.17 05/16/2020  BUN 19 05/16/2020  GLU 53 (L) 05/16/2020  CALCIUM 8.4 (L) 05/16/2020 PT/INR/PTTLab Results Component Value Date  INR 1.06 05/15/2020 Microbiology:No results found for: Jonah Blue EXAMPatient exam or treatment required medical chaperone.The sensitive parts of the examination were performed with chaperone present: Yes; Chaperone Name, Role/Title: Dr. Lorenza Burton Gen: resting in bed in no acute distressCV: regular ratePulm: comfortable on room air Abd: soft, non-tender, protuberant abdomen. No suprapubic tenderness. Left nephrotomy tube draining dark red urine. Right nephrostomy tube draining clear yellow urineGU: Clots per urethral meatusExt: warm, well perfused Assessment Max Snyder is a 79 y.o. male, Hospital Day: 2, w/ PMH of atrial fibrillation (previously on Eliquis, held), CAD s/p stents, recent DVT/pulmonary embolism (s/p IVC filter placement on 12/9), T2DM, HTN, Lyme disease, CVA, EtOH use disorder and Stage 2 NMIBC (prostatic involvement) followed by Dr. Carles Collet and Dr. Vassie Loll s/p TURBTs and radiation therapy complicated by refractory radiation cystitis s/p hyperbaric oxygen, multiple clot evacuations/fulgurations, intravesical therapies (Amicar, Formalin 2%) and now with s/p bilateral nephrostomy tube placement on 04/19/20 with ongoing hematuria from his nephrostomy tubes and bladder. This morning, he has hematuria from his left nephrostomy tube. Will plan on obtaining outside reports along with [place] urogram to evaluate his collecting system. Plan Cardiac DietPain: Tylenol, Oxycodone SS, Lidocaine jelly PRNCT UrogramFollow up OSH urine culturesBladder spasms: Oxybutynin 15mg  XL, B&O suppositories PRNTrend H+H, Transfuse PRNBilateral nephrostomy tube drains to gravityIncentive SpirometryNasal Oxygen PRNDVT PPX:  SCDs OOB, AmbulateDispo: floorSigned:Chrystine Oiler, MDFloor Pager (for primary patients): [phone] Pager: [phone] from 6am-6pm. Amion for nights/weekends.Attending Addendum:

## 2020-05-16 NOTE — Plan of Care
Plan of Care Overview/ Patient Status    Problem: Adult Inpatient Plan of CareGoal: Plan of Care ReviewOutcome: Interventions implemented as appropriate0700-1900Patient alert and orientedVital signs stable on room airPain controlledAx1 OOBBilateral neph tubes to gravity. L with sanguinous, red output with clots, unchanged. R with clear yellow output. Adequate urine outputSanguinous discharge from urethral meatus unchanged Tolerating regular dietBM this shiftSL port +flush +return2 units pRBC givenAll safety measures in place, see flowsheets, WCTMAdd:1700: Both neph tubes with dark red outputElectronically Signed by Sharyon Medicus, RN, May 16, 2020

## 2020-05-16 NOTE — Plan of Care
Problem: Adult Inpatient Plan of CareGoal: Readiness for Transition of CareOutcome: Interventions implemented as appropriate Plan of Care Overview/ Patient Status    Case Management Screening    Most Recent Value Case Management Screening: Chart review completed. If YES to any question below then proceed to CM Eval/Plan Is there a change in their cognitive function No Is there a change in their base line physical function. No Has there been a readmission within the last 30 days No Were there services prior to admission ( Examples: Assisted Living, HD, Homecare, Extended Care Facility, Methadone, SNF, Outpatient Infusion Center) Yes Negative/Positive Screen Positive Screening: Complete CM Evaluation and Plan IF Screening Completed by TC  Screening completed by: Name Ardyth Gal Role: Transition Coordinator Case Manager Attestation: Choose which ONE is appropriate for you I have reviewed the medical record and agree with the above screening by the transition coordinator with the following recommendations. Yes   CM Assessment and Discharge Planning    Most Recent Value Case Management Evaluation and Plan Arrived from prior to admission acute care facility Admitted from: OSH: [facility] Do you have a caregiver? No  Lives With Spouse Services Prior to Admission home care agency (specify services) Home care agency name: [facility] Agency Services:  RN visit Patient Requires Care Coordination Intervention Due To discharge planning needs/concerns, resumption of services prior to admission Documented Insurance Accurate Yes  [Medicare A&B/ [employer]] Patient's home address verified Yes Patient's PCP of record verified Yes Last Date Seen by PCP 0-3 months Living Environment  Lives With Spouse Source of Clinical History Patient's clinical history has been reviewed and source of Information is: Patient, Medical record Case Manager Attestation: Choose which ONE is appropriate for you I have reviewed the medical record and completed the above evaluation with the following recommendations. Yes Discharge Planning Coordination Recommendations Discharge Planning Coordination Recommendations Home with Homecare Services Case Manager reviewed plan of care/ continuum of care need's with  Patient, Transitional care rounds Discharge Planning Patient/Patient Representative goals/treatment preferences for discharge are:  return home Patient/Patient Representative was presented with a list of facilities, agencies and/or dme providers and Referral(s) placed for: Homecare services Homecare Preference(s) [facility] Mode of Transportation  Private car  (add comment for special considerations) Patient accompanied by family member  Per H&P: Max Snyder is a 79 y.o. male, Hospital Day: 2, w/ PMH of atrial fibrillation (previously on Eliquis, held), CAD s/p stents, recent DVT/pulmonary embolism (s/p IVC filter placement on 12/9), T2DM, HTN, Lyme disease, CVA, EtOH use disorder and Stage 2 NMIBC (prostatic involvement) followed by Dr. Carles Collet and Dr. Vassie Loll s/p TURBTs and radiation therapy complicated by refractory radiation cystitis s/p hyperbaric oxygen, multiple clot evacuations/fulgurations, intravesical therapies (Amicar, Formalin 2%) and now with s/p bilateral nephrostomy tube placement on 04/19/20 with ongoing hematuria from his nephrostomy tubes and bladder. This morning, he has hematuria from his left nephrostomy tube. Will plan on obtaining outside reports along with [place] urogram to evaluate his collecting system. Medical record reviewed. Met with patient at bedside and explained role of Care Management. Patient in hospital under OBS status. OBS status discussed with patient who verbalized understanding. Mr. Max Snyder is alert and oriented. He resides with his spouse in a 2 story home in [place], [place]. Patient states that he has been staying in 1st level bedroom as not feeling up to climbing stairs to normal 2nd level bedroom. Prior to admission, patient was independent with mobility and ADLs. Patient performs self glucose testing 3-4 x week. Home care  services were in place from [place] ([place]). Access: port.Discussed discharge needs with patient. He is in agreement with resumption of home care services from [place]  upon discharge. Referral sent. Care management will follow closely with team for post acute needs. Transportation home by family member when discharge ready.Max Snyder R.N. BS. Case ManagementYale Grand [facility] [place], [place] : [phone]

## 2020-05-16 NOTE — Utilization Review (ED)
UM Status: UR/MD agree on IP status after further review, received 2 units prior to arrival from OSH, today H/H 7/23, planned for another transfusion 2 units, monitoring H/H, +hematuria, [place] showing large amount of blood clot within bladder, underlying tumor is not excluded, febrile 100. 3 this am, HR 100s.  Reached out to team Dr. Minus Liberty, no d/c today, continue with tx/work up, in agreement with IP, adt 126 entered, order and class verified, code 44 removed.

## 2020-05-17 ENCOUNTER — Encounter: Admit: 2020-05-17 | Payer: PRIVATE HEALTH INSURANCE | Attending: Certified Registered"

## 2020-05-17 LAB — CBC WITH AUTO DIFFERENTIAL
BKR ALBUMIN: 9.4 fL — ABNORMAL LOW (ref 8.0–12.0)
BKR WAM ABSOLUTE IMMATURE GRANULOCYTES.: 0.05 x 1000/??L (ref 0.00–0.30)
BKR WAM ABSOLUTE LYMPHOCYTE COUNT.: 0.42 x 1000/ÂµL — ABNORMAL LOW (ref 0.60–3.70)
BKR WAM ABSOLUTE NRBC (2 DEC): 0 x 1000/??L (ref 0.00–1.00)
BKR WAM ANALYZER ANC: 5.89 x 1000/??L (ref 2.00–7.60)
BKR WAM BASOPHIL ABSOLUTE COUNT.: 0.01 x 1000/??L (ref 0.00–1.00)
BKR WAM BASOPHILS: 0.1 % (ref 0.0–1.4)
BKR WAM EOSINOPHIL ABSOLUTE COUNT.: 0.01 x 1000/??L (ref 0.00–1.00)
BKR WAM EOSINOPHILS: 0.1 % — ABNORMAL HIGH (ref 0.0–5.0)
BKR WAM HEMATOCRIT (2 DEC): 25.5 % — ABNORMAL LOW (ref 38.50–50.00)
BKR WAM HEMOGLOBIN: 8.2 g/dL — ABNORMAL LOW (ref 13.2–17.1)
BKR WAM IMMATURE GRANULOCYTES: 0.7 % (ref 0.0–1.0)
BKR WAM LYMPHOCYTES: 5.8 % — ABNORMAL LOW (ref 17.0–50.0)
BKR WAM MCH (PG): 28.3 pg (ref 27.0–33.0)
BKR WAM MCHC: 32.2 g/dL (ref 31.0–36.0)
BKR WAM MCV: 87.9 fL (ref 80.0–100.0)
BKR WAM MONOCYTE ABSOLUTE COUNT.: 0.83 x 1000/??L (ref 0.00–1.00)
BKR WAM MONOCYTES: 11.5 % (ref 4.0–12.0)
BKR WAM MPV: 9.4 fL (ref 8.0–12.0)
BKR WAM NEUTROPHILS: 81.8 % — ABNORMAL HIGH (ref 39.0–72.0)
BKR WAM NUCLEATED RED BLOOD CELLS: 0.3 % (ref 0.0–1.0)
BKR WAM PLATELETS: 196 x1000/ÂµL (ref 150–420)
BKR WAM RDW-CV: 17.2 % — ABNORMAL HIGH (ref 11.0–15.0)
BKR WAM RED BLOOD CELL COUNT.: 2.9 M/??L — ABNORMAL LOW (ref 4.00–6.00)
BKR WAM WHITE BLOOD CELL COUNT: 7.2 x1000/ÂµL (ref 4.0–11.0)

## 2020-05-17 LAB — IRON AND TIBC
BKR IRON SATURATION: 11 % — ABNORMAL LOW (ref 15–50)
BKR IRON: 13 ug/dL — ABNORMAL LOW (ref 59–158)
BKR TOTAL IRON BINDING CAPACITY: 120 ug/dL — ABNORMAL LOW (ref 250–450)

## 2020-05-17 LAB — COMPREHENSIVE METABOLIC PANEL
BKR A/G RATIO: 0.8 % — ABNORMAL LOW (ref 1.0–2.2)
BKR ALANINE AMINOTRANSFERASE (ALT): 109 U/L — ABNORMAL HIGH (ref 9–59)
BKR ALKALINE PHOSPHATASE: 114 U/L (ref 9–122)
BKR ANION GAP: 12 (ref 7–17)
BKR ASPARTATE AMINOTRANSFERASE (AST): 124 U/L — ABNORMAL HIGH (ref 10–35)
BKR AST/ALT RATIO: 1.1 % (ref 0.0–1.0)
BKR BILIRUBIN TOTAL: 1 mg/dL (ref <=1.2)
BKR BLOOD UREA NITROGEN: 20 mg/dL — ABNORMAL HIGH (ref 8–23)
BKR BUN / CREAT RATIO: 15.4 (ref 8.0–23.0)
BKR CALCIUM: 8 mg/dL — ABNORMAL LOW (ref 8.8–10.2)
BKR CHLORIDE: 103 mmol/L (ref 98–107)
BKR CO2: 18 mmol/L — ABNORMAL LOW (ref 20–30)
BKR CREATININE: 1.3 mg/dL (ref 0.40–1.30)
BKR EGFR (AFR AMER): >60 mL/min/{1.73_m2} (ref >60)
BKR EGFR (NON AFRICAN AMERICAN): 53 mL/min/1.73m2 — ABNORMAL LOW (ref >60)
BKR GLOBULIN: 2.8 g/dL (ref 2.3–3.5)
BKR GLUCOSE: 160 mg/dL — ABNORMAL HIGH (ref 70–100)
BKR POTASSIUM: 4 mmol/L (ref 3.3–5.3)
BKR PROTEIN TOTAL: 5 g/dL — ABNORMAL LOW (ref 6.6–8.7)
BKR SODIUM: 133 mmol/L — ABNORMAL LOW (ref 136–144)

## 2020-05-17 LAB — HOMOCYSTEINE     (BH GH LMW Q YH): BKR HOMOCYSTEINE: 11.6 umol/L (ref 4.0–15.0)

## 2020-05-17 LAB — PT/INR AND PTT (BH GH L LMW YH)
BKR INR: 1.12 (ref 0.87–1.14)
BKR PARTIAL THROMBOPLASTIN TIME: 22.1 s — ABNORMAL LOW (ref 23.0–31.4)
BKR PROTHROMBIN TIME: 12.1 s (ref 9.6–12.3)

## 2020-05-17 LAB — FERRITIN: BKR FERRITIN: 798 ng/mL — ABNORMAL HIGH (ref 30–400)

## 2020-05-17 LAB — BILIRUBIN, DIRECT: BKR BILIRUBIN DIRECT: 0.5 mg/dL — ABNORMAL HIGH (ref <=0.3)

## 2020-05-17 LAB — FIBRINOGEN     (BH GH L LMW YH): BKR FIBRINOGEN LEVEL: 646 mg/dL — ABNORMAL HIGH (ref 194–448)

## 2020-05-17 LAB — HEMOGLOBIN AND HEMATOCRIT, BLOOD
BKR WAM HEMATOCRIT (2 DEC): 27.9 % — ABNORMAL LOW (ref 38.50–50.00)
BKR WAM HEMOGLOBIN: 8.6 g/dL — ABNORMAL LOW (ref 13.2–17.1)

## 2020-05-17 LAB — BLOOD SMEAR MD INTERPRETATION     (YH)

## 2020-05-17 LAB — D-DIMER, QUANTITATIVE: BKR D-DIMER: 4.2 mg{FEU}/L — ABNORMAL HIGH (ref <=0.79)

## 2020-05-17 MED ORDER — FENTANYL (PF) 50 MCG/ML INJECTION SOLUTION
50 mcg/mL | Status: CP
Start: 2020-05-17 — End: ?

## 2020-05-17 MED ORDER — CEFTRIAXONE 1 GRAM SOLUTION FOR INJECTION
1 gram | Status: CP
Start: 2020-05-17 — End: ?

## 2020-05-17 MED ORDER — HYDROMORPHONE 2 MG/ML INJECTION SOLUTION
2 mg/mL | Status: CP
Start: 2020-05-17 — End: ?

## 2020-05-17 MED ORDER — BELLADONNA ALKALOIDS-OPIUM 16.2 MG-60 MG RECTAL SUPPOSITORY
Freq: Once | RECTAL | Status: CP
Start: 2020-05-17 — End: ?
  Administered 2020-05-17: 19:00:00 via RECTAL

## 2020-05-17 MED ORDER — OXYBUTYNIN CHLORIDE ER 5 MG TABLET,EXTENDED RELEASE 24 HR
5 mg | Freq: Every evening | ORAL | Status: DC
Start: 2020-05-17 — End: 2020-05-19
  Administered 2020-05-17 – 2020-05-19 (×2): 5 mg via ORAL

## 2020-05-17 MED ORDER — LIDOCAINE 5 % ADHESIVE PATCH
5 % | TRANSDERMAL | Status: DC
Start: 2020-05-17 — End: 2020-05-19

## 2020-05-17 MED ORDER — MIDAZOLAM 1 MG/ML INJECTION SOLUTION
1 mg/mL | Status: CP
Start: 2020-05-17 — End: ?

## 2020-05-17 MED ORDER — CEFTAZIDIME IV PUSH 2000 MG VIAL & NS (ADULT)
Freq: Two times a day (BID) | INTRAVENOUS | Status: DC
Start: 2020-05-17 — End: 2020-05-18
  Administered 2020-05-17 – 2020-05-18 (×2): 20.000 mL via INTRAVENOUS

## 2020-05-17 NOTE — Procedures
[facility]-YscBrief Procedure Note:Patient name: Max Snyder ZOX:[id number] Date of procedure: 12/30/2021Procedure(s): Pelvic angiogram and bilateral vesicular artery embolizationSedation method: Moderate Sedation. Patient reassessed immediately prior to the procedure before sedation administered.Pre Procedure Diagnosis: Hemorrhagic cystitisPost Procedure Diagnosis: Hemorrhagic cystitisIndications: Persistent hematuriaPrimary Procedure Attending: Santina Evans, MD - PrimaryAssistant: Anson Fret, MDAnesthesia: None- unless otherwise specifiedSpecimen(s) removed: None  Additional studies ordered: None- unless otherwise specifiedDrains: None- unless otherwise specifiedEstimated Blood Loss: Minimal- unless otherwise specifiedComplications: None- unless otherwise specifiedVascular closure method: 6 [place] Angio-SealFindings/Notes/Comments:Pelvic angiogram was performed that demonstrated somewhat prominent bilateral vesicular arteries. Subsequently, bilateral vesicular artery was embolized with 300-500 micron PVA particles. In addition, left vesicular artery was also embolized with gelfoam. Right groin access was closed with 6 [place] Angio-Seal. Plan: - Bedrest for 2 hours.  - Continue to monitor hematuria. Verbal Orders and Supervisory Attestation: As the procedural physician / APP, I verify I was present throughout the entire procedure and provided physician orders as documented in the specialty electronic documentation system. Furthermore I directly supervised all non physicians providers who assisted me duing the procedure and I accept delegation of supervisory responsibility for all post procedure patient care activity related to this patient that I request from a [facility] PA/APRN/staff member.Anson Fret, MD12/30/20211:25 PM

## 2020-05-17 NOTE — Other
PHARMACY-ASSISTED MEDICATION REPORTPharmacist review of the best possible medication history obtained by the pharmacy medication history technician has been performed.  I have updated the home medication list and identified the following information that may be relevant to this admission.NOTES/RECOMMENDATIONS Restart alprazolam PRN if needed Consider starting home tadalafil if clinically appropriate        Prior to Admission Medications Medication Name Sig Taking? Patient Reported   ALPRAZolam (XANAX) 1 mg tabletLast dose:  --  Take 1 mg by mouth 3 (three) times daily as needed for anxiety. Yes Yes   amLODIPine (NORVASC) 10 mg tabletLast dose:  --  Take 10 mg by mouth daily.  Yes Yes   aspirin 81 mg EC delayed release tabletLast dose: Not Taking at Unknown time Take 81 mg by mouth.Patient not taking: Reported on 05/16/2020   Yes   atorvastatin (LIPITOR) 80 mg tabletLast dose:  --  Take 80 mg by mouth at bedtime.  Yes Yes   dilTIAZem (CARDIZEM) 30 mg Immediate Release tabletLast dose:  --  Take 30 mg by mouth 4 (four) times daily. Yes Yes   ferrous sulfate (FEOSOL) 325 mg (65 mg iron) tabletLast dose:  --  Take 1 tablet by mouth daily. Yes Yes   glimepiride (AMARYL) 2 mg tabletLast dose:  --  Take 2 mg by mouth every morning.  Yes Yes   losartan (COZAAR) 100 mg tabletLast dose: Not Taking at Unknown timeLast Medication Note: >> Albina Billet May 16, 2020 12:20 PMMedHX Tech(Yanitza Howie Ill, CPHT):  spouse reports medication was stopped during last hospital encounter at [facility] in [place], [place]Entered by Quentin Cornwall, CPHT Wed May 16, 2020 1220 None EnteredPatient not taking: Reported on 05/16/2020   Yes   oxybutynin XL (DITROPAN XL) 15 mg 24 hr tabletLast dose:  --  Take 15 mg by mouth daily. Yes Yes   oxyCODONE (ROXICODONE) 5 mg Immediate Release tabletLast dose:  --  Take 5 mg by mouth every 6 (six) hours as needed. Yes Yes   pantoprazole (PROTONIX) 40 mg tabletLast dose:  -- Last Medication Note: >> Albina Billet May 16, 2020 12:23 PMMedHX Tech(Yanitza Howie Ill, CPHT):  spouse reports pt takes one half tab (20mg  total) daily Entered by Quentin Cornwall, CPHT Wed May 16, 2020 1223 Take 40 mg by mouth daily.  Yes Yes   polyethylene glycol (MIRALAX) 17 gram packetLast dose:  --  Take 17 g by mouth daily. Mix in 8 ounces of water, juice, soda, coffee or tea prior to taking. Yes Yes   senna-docusate (SENNA-PLUS) 8.6-50 mg tabletLast dose:  --  Take 1 tablet by mouth daily. Yes Yes   tadalafiL (CIALIS) 5 mg tabletLast dose:  --  Take 5 mg by mouth daily.  Yes Yes   trospium (SANCTURA) 20 mg tabletLast dose:  --  Take 20 mg by mouth 2 (two) times daily. Yes Yes   Prior to admission medications last reviewed by Quentin Cornwall, CPHT on Wed May 16, 2020 1226 Thank you,Romaldo [place], PharmD12/30/20212:44 PMPhone: MHB

## 2020-05-17 NOTE — Progress Notes
Urologic Surgery Progress NoteAttending Provider: Michelene Gardener, MDAdmit Date: 12/28/2021Hospital Day: 3Code status: Full Code/ACLSAllergy: Lisinopril and Sulfa (sulfonamide antibiotics)Background:  Max Snyder is a 79 y.o. male w/ PMH of atrial fibrillation (previously on Eliquis, held), CAD s/p stents, recent DVT/pulmonary embolism (s/p IVC filter placement on 12/9), T2DM, HTN, Lyme disease, CVA, EtOH use disorder and Stage 2 NMIBC (prostatic involvement) followed by Dr. Carles Collet and Dr. Vassie Loll s/p TURBTs and radiation therapy complicated by refractory radiation cystitis s/p hyperbaric oxygen, multiple clot evacuations/fulgurations, intravesical therapies (Amicar, Formalin 2%) and now with s/p bilateral nephrostomy tube placement on 04/19/20 with ongoing hematuria from his nephrostomy tubes and bladder.Subjective/Interim Events: NAEON. AF. HDS. Pain controlled. Agreeable to bladder embolization this morning - will reach out to IRContinued to pass large clots via penis per nursing staff. NPO overnight. Hgb/Hct - 8.2/25.5 (9.5/28.5) Cr - 1.3 (1.17) Objective: Temp:  [97.9 ?F (36.6 ?C)-99.3 ?F (37.4 ?C)] 98 ?F (36.7 ?C)Pulse:  [83-109] 89Resp:  [16-20] 20BP: (99-151)/(58-70) 105/60SpO2:  [94 %-99 %] 95 %Device (Oxygen Therapy): room airI/O last 3 completed shifts:In: 1740 [P.O.:840; I.V.:200; Blood:700]Out: 1655 [Urine:1655] CBCLab Results Component Value Date  WBC 7.2 05/17/2020  HGB 8.2 (L) 05/17/2020  HCT 25.50 (L) 05/17/2020  PLT 196 05/17/2020 ELECTROLYTESLab Results Component Value Date  NA 133 (L) 05/17/2020  K 4.0 05/17/2020  CL 103 05/17/2020  CO2 18 (L) 05/17/2020  CREATININE 1.30 05/17/2020  BUN 20 05/17/2020  GLU 137 (H) 05/17/2020  CALCIUM 8.0 (L) 05/17/2020 PT/INR/PTTLab Results Component Value Date  INR 1.12 05/17/2020  PTT 22.1 (L) 05/17/2020 Microbiology:Lab Results Component Value Date  LABURIN (A) 05/16/2020   10,000-49,000 CFU/mL Gram Negative Rods, Non-Lactose Fermenting  LABURIN >=100,000 CFU/mL Viridans Streptococcus (A) 05/16/2020  LABURIN (A) 05/16/2020   >=100,000 CFU/mL Gram Negative Rods, Non-Lactose Fermenting  LABURIN >=100,000 CFU/mL Viridans Streptococcus (A) 05/16/2020 PHYSICAL EXAMDr. Murlean Iba Choksi was present as a chaperone for the GU portion of the physical examGen: awake, alert, NAD CV: regular rate Pulm: non-labored breathing on RA Abd: softly distended, left NT draining red urine, right NT draining red urine GU: clots per urethral meatus Ext: WWP Assessment Max Snyder is a 79 y.o. male, Hospital Day: 3, with PMHx of atrial fibrillation (previously on Eliquis, held), CAD s/p stents, recent DVT/pulmonary embolism (s/p IVC filter placement on 12/9), T2DM, HTN, Lyme disease, CVA, EtOH use disorder and Stage 2 NMIBC (prostatic involvement) followed by Dr. Carles Collet and Dr. Vassie Loll s/p TURBTs and radiation therapy complicated by refractory radiation cystitis s/p hyperbaric oxygen, multiple clot evacuations/fulgurations, intravesical therapies (Amicar, Formalin 2%) and now with s/p bilateral nephrostomy tube placement on 04/19/20 with ongoing hematuria from his nephrostomy tubes and bladder.  [place] Urogram yesterday revealed large clot burden in bladder. Patient agreeable to bladder embolization this morning. Will communicate with IR. Plan To IR for bladder embolization today Multimodal pain control NPO for procedure Continue appropriate home meds SQH for chemical DVT ppx LDISS Oxybutynin for bladder spasms with PRN B&O suppositories Maintain bilateral nephrostomy tubes to gravity drainage ISNasal oxygen PRN Dispo: Floor Signed:Daaiyah Baumert Redmond Baseman, MDUrology PGY-[id number] Floor Pager (for primary patients): [phone] Consult Pager: [phone] from 6am-6pmSRC Urology Pager: MHB Dynamic Role from 6am-6pmAmion for nights/weekendsAttending Addendum:

## 2020-05-17 NOTE — Progress Notes
[facility] Interventional RadiologyBrief Note Followed up today regarding Max Snyder. Risks including off-target embolization, bleeding, and infection again discussed at length. Benefits also discussed. He is agreeable to undergo pelvic angiography and bladder embolization today. Negative COVID on 12/28. Platelets and INR are acceptable. He has been NPO since midnight. Consent has been obtained, and he will be scheduled for pelvic angiography and hopefully bladder embolization this afternoon. Thank you for involving Interventional Radiology in the care of your patient. Please text or call my Mobile Heart Beat with any questions or concerns.With urgent questions or concerns, please contact IR DD:[id number] (pagers)- YSC: [phone]- SRC: [phone]- BH: [phone] (phone, all locations)- [phone] Noreene Larsson Meredith Pel, MD 05/17/2020

## 2020-05-17 NOTE — Anesthesia Procedure Notes
History & Physical Attestation and Pre-Procedural Moderate Sedation Assessment History & Physical Attestation: This is a non-emergent procedure. I have completed (or reviewed and attested to) a History and Physical Exam written within the last 30 days, which is in the patient record. The patient has been assessed and examined prior to this procedure and no changes were noted unless otherwise documented here: NonePhysician Airway Assessment:Normal: As the attending physician certified in moderate sedation and based on my review, immediately prior to this procedure, of the airway evaluation and other pertinent documentation in the patient's record, this patient is a suitable candidate for moderate sedation during the planned procedure.Procedural Sedation Risk Assessment:Additional risks, if any, related to procedural sedation are noted here: None

## 2020-05-17 NOTE — Other
HVC HANDOFF REPORTRefer to [facility] Report for medication administration, vital signs monitoring, assessments, and procedure details. To access this report;	1) Go to chart review	2) Go to Media tab	3) Look for document type of intra-procedure Log/[facility] as a scanned document in	    the list.Reporting RN Name:  Jane Birkel     Verbal Report given to: 	Contact Information:  [phone]        	     Family  The listContact Number:         Performing Provider/Contact Number: Dr Girtha Rm Medical History: Procedure performed: Pelvic angio with bladder vascular embolization.  RFA access, angiosealed at 1300.  Bedrest until 1500 per IR attendingLast VitalsTemp: Heart Rate: 101Rhythm: B/P: 114/64VAD B/P: Respiratory Rate: 15  O2 Sat.: 96Pain Level/ Intervention: LOC and/Or current Mental Status: Skin Integrity: Medications Administered	Sedation total(s): Fentanyl 250 mcg Versed 5mg  IV, Dilaudid 2mg  IV	Antibiotics(type,dose and time given): Ceftriaxone 1gram

## 2020-05-17 NOTE — Plan of Care
Plan of Care Overview/ Patient Status    2230-0700?A/O X4, VSS on room air. Bi-lateral nephro tubes in place, both to gravity bags. Both nephro tubes draining bloody drainage. Urethral meatus with bloody/clot discharge. Port dressing CDI. IVF infusing as ordered. NPO at midnight.  Continue to mon.

## 2020-05-17 NOTE — Plan of Care
Plan of Care Overview/ Patient Status    Problem: Adult Inpatient Plan of CareGoal: Plan of Care ReviewOutcome: Interventions implemented as appropriate0700-1900Patient alert and orientedSBP elevated r/t late admin of morning cardiac meds - MDs awareSpasm pain moderately controlled Ax1 OOBBilateral neph tubes to gravity, both tubes with red/dark outputLarge blood clots from urethra, MD aware. No interventionsNo BM this shift, +flatusSL port +flush +returnNPO for IR procedure1400 - Returned from [employer] for 2hrsDiet advancedB&O suppository given for spasmsAll safety measures in place, see flowsheets, WCTMElectronically Signed by Sharyon Medicus, RN, May 17, 2020

## 2020-05-17 NOTE — Plan of Care
Plan of Care Overview/ Patient Status    SOCIAL WORK NOTEPatient Name: Max Snyder Record Number: [id number] Date of Birth: Dec 18, 1942Social Work Follow Up    Most Recent Value Document Type Progress Note (For Inpatient/ED Only) Prior psychosocial assessment has been documented within 30 days of this hospitalization No Reason for Encounter Psycho-Social Concerns Intervention Psycho-social Intervention Psycho-social Intervention Empathy, Validation Source of Information Patient Record Reviewed Yes Level of Care Inpatient Identified Clinical/Disposition, Issues/Barriers: Emotional Support Intervention(s)/Summary 15 minutes spent face to face with Max Snyder. I introduced myself and explained my role as a support person while hospitalized . Max Snyder was in a lot of pain and was only able to speak for a few minutes . I provided emotional support as he stated that he has struggled with hemorrhagic  cystitis for 16 years . He revealed that he has had over 100 procedures to attempt to treat his condition .  He did say that his main support has been his family . Max Snyder asked me to come back at another time as he was in too much pain to continue with assessment . I agreed to do so and explained due to holiday and work schedule I would not be able to return until Tuesday , Jan 4 . Collaboration with Treatment Team/Community Providers/Family: Medical team to be updated . Outcome Resolved Handoff Required? No Next Steps/Plan (including hand-off): Social work intervention completed for today . Will see Max Snyder on Jan 4 to complete full assessment . Signature: Max Kluver LCSW Contact Information: [phone]

## 2020-05-18 LAB — BASIC METABOLIC PANEL
BKR ANION GAP: 12 (ref 7–17)
BKR BLOOD UREA NITROGEN: 18 mg/dL (ref 8–23)
BKR BUN / CREAT RATIO: 15.7 (ref 8.0–23.0)
BKR CALCIUM: 8.4 mg/dL — ABNORMAL LOW (ref 8.8–10.2)
BKR CHLORIDE: 102 mmol/L (ref 98–107)
BKR CO2: 19 mmol/L — ABNORMAL LOW (ref 20–30)
BKR CREATININE: 1.15 mg/dL (ref 0.40–1.30)
BKR EGFR (AFR AMER): >60 mL/min/{1.73_m2} (ref >60)
BKR EGFR (NON AFRICAN AMERICAN): >60 mL/min/{1.73_m2} (ref >60)
BKR POTASSIUM: 4 mmol/L (ref 3.3–5.3)
BKR SODIUM: 133 mmol/L — ABNORMAL LOW (ref 136–144)
BKR WAM HEMATOCRIT (2 DEC): 102 mmol/L — ABNORMAL LOW (ref 98–107)

## 2020-05-18 LAB — URINE CULTURE
BKR URINE CULTURE, ROUTINE: >=100000 — AB
BKR URINE CULTURE, ROUTINE: >=100000 — AB
BKR URINE CULTURE, ROUTINE: >=100000 — AB

## 2020-05-18 LAB — PARTIAL THROMBOPLASTIN TIME     (BH GH LMW Q YH): BKR PARTIAL THROMBOPLASTIN TIME: 31.2 seconds (ref 23.0–31.4)

## 2020-05-18 LAB — HEMOGLOBIN AND HEMATOCRIT, BLOOD: BKR WAM HEMOGLOBIN: 8 g/dL — ABNORMAL LOW (ref 13.2–17.1)

## 2020-05-18 MED ORDER — AMOXICILLIN 500 MG CAPSULE
500 mg | Freq: Three times a day (TID) | ORAL | Status: DC
Start: 2020-05-18 — End: 2020-05-19
  Administered 2020-05-19 (×2): 500 mg via ORAL

## 2020-05-18 MED ORDER — DILTIAZEM IMMEDIATE RELEASE 30 MG TABLET
30 mg | Freq: Four times a day (QID) | ORAL | Status: DC
Start: 2020-05-18 — End: 2020-05-18

## 2020-05-18 MED ORDER — MULTIVITAMIN WITH FOLIC ACID 400 MCG TABLET
400 mcg | Freq: Every day | ORAL | Status: DC
Start: 2020-05-18 — End: 2020-05-19
  Administered 2020-05-18 – 2020-05-19 (×2): 400 mcg via ORAL

## 2020-05-18 MED ORDER — BELLADONNA ALKALOIDS-OPIUM 16.2 MG-60 MG RECTAL SUPPOSITORY
Freq: Every day | RECTAL | 1 refills | Status: AC | PRN
Start: 2020-05-18 — End: ?

## 2020-05-18 MED ORDER — CIPROFLOXACIN 500 MG TABLET
500 mg | Freq: Two times a day (BID) | ORAL | Status: DC
Start: 2020-05-18 — End: 2020-05-19
  Administered 2020-05-18 – 2020-05-19 (×2): 500 mg via ORAL

## 2020-05-18 MED ORDER — BELLADONNA ALKALOIDS-OPIUM 16.2 MG-30 MG RECTAL SUPPOSITORY
Freq: Two times a day (BID) | RECTAL | 1 refills | Status: AC | PRN
Start: 2020-05-18 — End: ?

## 2020-05-18 NOTE — Plan of Care
Plan of Care Overview/ Patient Status    0700-1900Neuro: A & O x 4CV: VSSRespiratory: On RA, LS clear. I/S use encouragedSkin: Grossly intactDrains/tubes: Bilateral nephrostomy tubes to back; dressings intact, no drainage. Both flushed per confirmation with provider; 10cc. New order in.Left more red in color, right more amberGU/GI: Denies N/V. +BS. No BM. Denies clots in urinePain: Asked for suppository this morning for spasms. IV access: Port accessed; dressing intact, with +blood flowDiet: Consistent carbMobility: OOB with assist x 1 and RW, up in chairSafety: Calls appropriatelyWill continue to monitor, please see flow sheet for additional information. Call bell within reach, safety measures maintained, hourly rounding complete.

## 2020-05-18 NOTE — Consults
[facility] CenterHematology Consult NoteCONSULTED BY: Attending Provider: Michelene Gardener, MD [phone] OF SERVICE: 12/30/2021REASON FOR CONSULT: evaluation of bleedingHistory of Present Illness:  CC: Max Snyder is a 79 y.o. 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 Hematology is consulted for workup of ongoing bleeding.His NIMBC was diagnosed in mid 2020, found to have 2-5cm of tumor on cysto with invasion into prostatic stroma. He declined extensive surgical intervention, rather underwent numerous TURBTs, completed 8 rounds of intravesicular gemcitabine and 35 radiation treatments (02/09/19). Follows with Dr. Daine Gravel at [facility]. In mid-2021 had recurrence on surveillance cystoscopy s/p subsequent TURBTs which were complicated by numerous ED presentations for recurrent gross hematuria 2/2 refractory radiation cystitis requiring multiple interventions including CBI. Most recently was hospitalized 12/1-12/21 and underwent hyperbaric O2, amicar and formalin instillations, clot evacuations, and ultimately bilateral nephrostomy tubes. Also c/b afib with RVR, DVT below knee, RLL SSPE s/p IVC filter 12/9. He also received a course of IV venofer for iron deficiency during this admission.He represented on 12/28 with recurrent bleeding and Hgb to down to 5.4 s/p 2U pRBCs, but had continued bleeding with clots at urethral meatus and bilateral nephrostomy tubes. Of note, he has had prior renal angiogram to evaluate hematuria from neph tubes which was negative and a ureteral occluding devices were placed to prevent ureterovesicular reflux without improvement in bleeding. On initial exam by urology noted to have neph tubes draining yellow urine with debris with small clots at the urethral meatus, but subsequent exams have noted dark red urine in L neph tube. Hgb improved to 7.2 and received another 2U pRBCs with improvement to 9.5. Today, hgb 8.5, platelets 196, fibrinogen 646, inr 1.2, PTT 22.1, Retic index 0.55.  urogram with large clot burden in bladder. He is undergoing bladder embolization with interventional radiology today.On interview with the patient, he reports that the hematuria from the nephrostomy tubes has been waxing and waning since they were placed. He denies any history of bleeding prior to his cancer diagnosis. He denies any other sites of bleeding besides the GU tract, including hematemesis, melena, hematochezia, rashes or bruises. He denies any family history of bleeding or clotting. He denies fevers or chills. Medical History: Past Medical History Past Surgical History Past Medical History: Diagnosis Date ? 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 Bladder cancer (HC Code)  ? Diabetes mellitus (HC Code)   Type 2 ? Hypertension  ? Kidney stone  ? Lyme disease  ? Stroke (HC Code) 2004  No past surgical history on file. Social History Family History Social History Tobacco Use ? Smoking status: Never Smoker ? Smokeless tobacco: Never Used Vaping Use ? Vaping Use: Never used Substance Use Topics ? Alcohol use: Yes ? Drug use: Never Social History Substance and Sexual Activity Drug Use Never  No family history on file. Outpatient Medications Prior to Admission Medication Medication Sig Dispense Refill ? ALPRAZolam (XANAX) 1 mg tablet Take 1 mg by mouth 3 (three) times daily as needed for anxiety.   ? amLODIPine (NORVASC) 10 mg tablet Take 10 mg by mouth daily.    ? atorvastatin (LIPITOR) 80 mg tablet Take 80 mg by mouth at bedtime.    ? dilTIAZem (CARDIZEM)  30 mg Immediate Release tablet Take 30 mg by mouth 4 (four) times daily.   ? ferrous sulfate (FEOSOL) 325 mg (65 mg iron) tablet Take 1 tablet by mouth daily.   ? glimepiride (AMARYL) 2 mg tablet Take 2 mg by mouth every morning.    ? oxybutynin XL (DITROPAN XL) 15 mg 24 hr tablet Take 15 mg by mouth daily.   ? oxyCODONE (ROXICODONE) 5 mg Immediate Release tablet Take 5 mg by mouth every 6 (six) hours as needed.   ? pantoprazole (PROTONIX) 40 mg tablet Take 40 mg by mouth daily.    ? polyethylene glycol (MIRALAX) 17 gram packet Take 17 g by mouth daily. Mix in 8 ounces of water, juice, soda, coffee or tea prior to taking.   ? senna-docusate (SENNA-PLUS) 8.6-50 mg tablet Take 1 tablet by mouth daily.   ? tadalafiL (CIALIS) 5 mg tablet Take 5 mg by mouth daily.    ? trospium (SANCTURA) 20 mg tablet Take 20 mg by mouth 2 (two) times daily.   ? aspirin 81 mg EC delayed release tablet Take 81 mg by mouth. (Patient not taking: Reported on 05/16/2020)   ? losartan (COZAAR) 100 mg tablet  (Patient not taking: Reported on 05/16/2020)    Current Medications Scheduled Meds:Current Facility-Administered Medications Medication Dose Route Frequency Provider Last Rate Last Admin ? acetaminophen (TYLENOL) tablet 975 mg  975 mg Oral Q6H Lucretia Kern, MD   975 mg at 05/17/20 0526 ? amLODIPine (NORVASC) tablet 5 mg  5 mg Oral Daily Mauro Kaufmann, MD   5 mg at 05/16/20 1020 ? dilTIAZem (CARDIZEM) Immediate Release tablet 30 mg  30 mg Oral Q6H Mauro Kaufmann, MD   30 mg at 05/17/20 5409 ? heparin (porcine) injection 5,000 Units  5,000 Units Subcutaneous Q12H Lucretia Kern, MD   5,000 Units at 05/16/20 2136 ? insulin lispro (Admelog, HumaLOG) Sliding Scale (See admin instructions for dose)   Subcutaneous TID AC Hanchuk, Judeth Cornfield, MD     ? insulin lispro (Admelog, HumaLOG) Sliding Scale (See admin instructions for dose) Subcutaneous Nightly Hanchuk, Judeth Cornfield, MD     ? oxybutynin XL (DITROPAN-XL) 24 hr tablet 15 mg  15 mg Oral Nightly Choksi, Normand Sloop, MD   15 mg at 05/16/20 2136 ? pantoprazole (PROTONIX) EC tablet 40 mg  40 mg Oral Daily Mauro Kaufmann, MD   40 mg at 05/16/20 8119 ? polyethylene glycol (MIRALAX) packet 17 g  17 g Oral Daily Hanchuk, Judeth Cornfield, MD     ? rosuvastatin (CRESTOR) tablet 40 mg  40 mg Oral Daily Lucretia Kern, MD   40 mg at 05/16/20 0931 ? sodium chloride 0.9 % flush 3 mL  3 mL IV Push Q8H Hanchuk, Judeth Cornfield, MD   3 mL at 05/15/20 2132 Continuous Infusions:? sodium chloride 75 mL/hr (05/16/20 1735) PRN Meds:.belladonna alkaloids-opium, dextrose (GLUCOSE) 40 % gel 15 g **OR** fruit juice **OR** skim milk, dextrose (GLUCOSE) 40 % gel 30 g **OR** fruit juice, dextrose 50% in water (D50W), dextrose 50% in water (D50W), glucagon, lidocaine uro-jet, oxyCODONE, oxyCODONE, sodium chloride Allergies Allergies Allergen Reactions ? Lisinopril Other (See Comments) ? Sulfa (Sulfonamide Antibiotics) Other (See Comments)  Review of Systems All other systems reviewed and are negative.Vital signs and Exam: Vitals:Temp:  [97.9 ?F (36.6 ?C)-99.3 ?F (37.4 ?C)] 98 ?F (36.7 ?C)Pulse:  [83-109] 89Resp:  [16-20] 20BP: (99-151)/(58-70) 105/60SpO2:  [94 %-99 %] 95 % on RA I/O's:Intake/Output Summary (Last 24 hours) at 05/17/2020 0813Last data filed at 05/17/2020 0804Gross per  24 hour Intake 1740 ml Output 1780 ml Net -40 ml  Physical Exam:Gen: chronically ill appearing, non-toxic, in mild distress due to bladder spasms post-embolizationHEENT: scleral anicteric, MMM, no oral lesionsCV: RRR, nl S1 S2, no murmurs/rubs/gallopsPulm: CTAB, no rhonchi, wheezes, cracklesAbd: soft, non-tender, non-distendedExt: WWP, no LE edema, 2+ pedal pulsesNeuro: A&Ox3, answering appropriately, no focal deficitsSkin: no rashes, bruises, petechiae, purpuraObjective Data: Recent Labs Lab 12/28/211800 12/28/211800 12/29/210509 12/29/211734 12/30/210532 WBC 8.0  --  6.6  --  7.2 HGB 7.2*   < > 7.1*   < > 8.2* HCT 23.10*   < > 23.20*   < > 25.50* PLT 212   < > 218  --  196  < > = values in this interval not displayed.  Recent Labs Lab 12/28/211800 12/29/210509 12/30/210532 NEUTROPHILS 86.0* 82.8* 81.8*  Recent Labs Lab 12/28/211800 12/28/212049 12/29/210509 12/29/210811 12/29/212143 12/30/210532 12/30/210733 NA 131*  --  136  --   --  133*  --  K 4.3   < > 3.9   < >  --  4.0  --  CL 99   < > 103   < >  --  103  --  CO2 19*   < > 20   < >  --  18*  --  BUN 23   < > 19   < >  --  20  --  CREATININE 1.19   < > 1.17   < >  --  1.30  --  GLU 128*   < > 53*   < >   < > 160* 137* ANIONGAP 13   < > 13   < >  --  12  --   < > = values in this interval not displayed.  Recent Labs Lab 12/28/211800 12/29/210509 12/30/210532 CALCIUM 8.2* 8.4* 8.0*  Recent Labs Lab 12/30/210532 ALT 109* AST 124* ALKPHOS 114 BILITOT 1.0 BILIDIR 0.5*  Recent Labs Lab 12/28/211800 12/30/210532 PTT  --  22.1* LABPROT 11.5 12.1 INR 1.06 1.12  Fibrinogen 646D-dimer 4.20Smear in processFerritin 798TIBC 120Vitamin B12 373Homocysteine 11. in processMicrobiology:UCx > 100,000 CFU Pseudomonas and strep viridansCOVID negativeImaging: [place] Abdomen Pelvis w wo IV ContrastResult Date: 12/29/2021Limited evaluation of the upper tracts as they are likely decompressed by bilateral nephrostomies. Bilateral ureteral occlusion devices are noted in the mid to distal ureters. Large amount of blood clot within the bladder, underlying tumor is not excluded. Report Initiated By:  Bonney Roussel, MD Reported And Signed By: Gary Angola, MD  [facility] Radiology and Biomedical ImagingAssessment and Plan: Mahyar Vink is a 78 y.o. 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 Hematology is consulted for workup of ongoing bleeding.Given his age, lack of other bleeding history and isolation of bleeding to the GU tract, very low likelihood of a congenital/genetic etiology of bleeding diathesis. In terms of secondary causes, no abnormalities in coagulation factors checked so far that could explain the bleeding. Ultimately suspect his bleeding can be explained by his complex GU anatomy/pathology, however will recommend completion of coagulation defect workup for fibrinogen dysfunction and acquired von willebrand as below. --please send thrombin time (added on for you)--please send von willebrand factor panel, vitamin C level with next labs--f/u smear, MMA for workup of reversible  causes of anemia--recommend holding SubQ heparin while there is concern for actively bleeding, favor SCDs for DVT prophylaxis--management of acute bleeding per primary team, appreciate excellent care Aretha Parrot, M.D.[facility], PGY-2Reach[facility] through [facility]. Preliminary recommendations, discussed with attending. Please see hematology consult attending Dr. Marlis Edelson addendum for final plan.

## 2020-05-18 NOTE — Plan of Care
Max Snyder is a 79 y.o. male s/p bilateral nephrostomy tube placement on 04/19/20 with ongoing hematuria from his nephrostomy tubes and bladder.  Vitals:  05/17/20 0735 05/17/20 1401 05/17/20 1635 05/17/20 1958 BP: 105/60 (!) 165/82 (!) 177/82 122/63 Pulse: 89 (!) 106 (!) 120 (!) 101 Resp: 20 17 17 18  Temp: 98 ?F (36.7 ?C) 97.8 ?F (36.6 ?C) 97.9 ?F (36.6 ?C) 98.9 ?F (37.2 ?C) TempSrc: Oral Oral Oral Oral SpO2: 95% 94% 96% 97% Weight:     Height:     Oxygen therapy Oxygen TherapySpO2: 97 %Device (Oxygen Therapy): room airCurrent Facility-Administered Medications Medication Dose Route Frequency Provider Last Rate Last Admin ? acetaminophen (TYLENOL) tablet 975 mg  975 mg Oral Q6H Lucretia Kern, MD   975 mg at 05/17/20 1732 ? amLODIPine (NORVASC) tablet 5 mg  5 mg Oral Daily Mauro Kaufmann, MD   5 mg at 05/17/20 1607 ? cefTAZIDime (FORTAZ) 2 g in sodium chloride 0.9 % 20 mL (100 mg/mL)  2 g Intravenous Q12H Choksi, Ankur Ushir, MD   2 g at 05/17/20 1607 ? dilTIAZem (CARDIZEM) Immediate Release tablet 30 mg  30 mg Oral Q6H Mauro Kaufmann, MD   30 mg at 05/17/20 1732 ? [Held by provider] heparin (porcine) injection 5,000 Units  5,000 Units Subcutaneous Q12H Lucretia Kern, MD   5,000 Units at 05/16/20 2136 ? insulin lispro (Admelog, HumaLOG) Sliding Scale (See admin instructions for dose)   Subcutaneous TID AC Hanchuk, Judeth Cornfield, MD     ? insulin lispro (Admelog, HumaLOG) Sliding Scale (See admin instructions for dose)   Subcutaneous Nightly Hanchuk, Judeth Cornfield, MD     ? lidocaine (LIDODERM) 5 % 1 patch  1 patch Transdermal Q24H Choksi, Normand Sloop, MD   1 patch at 05/17/20 2005 ? oxybutynin XL (DITROPAN-XL) 24 hr tablet 15 mg  15 mg Oral Nightly Modena Morrow, MD   15 mg at 05/17/20 1607 ? pantoprazole (PROTONIX) EC tablet 40 mg  40 mg Oral Daily Mauro Kaufmann, MD   40 mg at 05/17/20 1607 ? polyethylene glycol (MIRALAX) packet 17 g  17 g Oral Daily Lucretia Kern, MD   17 g at 05/17/20 1617 ? rosuvastatin (CRESTOR) tablet 40 mg  40 mg Oral Daily Lucretia Kern, MD   40 mg at 05/17/20 1607 ? sodium chloride 0.9 % flush 3 mL  3 mL IV Push Q8H Lucretia Kern, MD   3 mL at 05/15/20 2132  ? sodium chloride 75 mL/hr (05/16/20 1735) belladonna alkaloids-opium, dextrose (GLUCOSE) 40 % gel 15 g **OR** fruit juice **OR** skim milk, dextrose (GLUCOSE) 40 % gel 30 g **OR** fruit juice, dextrose 50% in water (D50W), dextrose 50% in water (D50W), glucagon, lidocaine uro-jet, oxyCODONE, oxyCODONE, sodium chlorideCurrent Facility-Administered Medications Medication Dose Route Frequency Last Rate ? acetaminophen  975 mg Oral Q6H   ? amLODIPine  5 mg Oral Daily   ? belladonna alkaloids-opium  1 suppository Rectal BID PRN   ? cefTAZIDime  2 g Intravenous Q12H   ? dextrose (GLUCOSE) 40 % gel 15 g  15 g Oral Q15 MIN PRN    Or ? fruit juice  120 mL Oral Q15 MIN PRN    Or ? skim milk  240 mL Oral Q15 MIN PRN   ? dextrose (GLUCOSE) 40 % gel 30 g  30 g Oral Q15 MIN PRN    Or ? fruit juice  240 mL Oral Q15 MIN PRN   ? dextrose 50% in water (D50W)  12.5 g IV  Push Q15 MIN PRN   ? dextrose 50% in water (D50W)  25 g IV Push Q15 MIN PRN   ? dilTIAZem  30 mg Oral Q6H   ? glucagon  1 mg Intramuscular Once PRN   ? [Held by provider] heparin (porcine)  5,000 Units Subcutaneous Q12H   ? insulin lispro   Subcutaneous TID AC   ? insulin lispro   Subcutaneous Nightly   ? Lidocaine Patch  1 patch Transdermal Q24H   ? lidocaine uro-jet  11 mL INTRA-URETHRAL 4x DAILY PRN   ? oxybutynin XL  15 mg Oral Nightly   ? oxyCODONE  10 mg Oral Q4H PRN   ? oxyCODONE  5 mg Oral Q4H PRN   ? pantoprazole  40 mg Oral Daily   ? polyethylene glycol  17 g Oral Daily   ? rosuvastatin  40 mg Oral Daily   ? sodium chloride  3 mL IV Push Q8H   ? sodium chloride  3 mL IV Push PRN for Line Care ? sodium chloride  75 mL/hr Intravenous Continuous 75 mL/hr (05/16/20 1735) .Comments: Patient resting in bed in no acute distress. Some pain with bladder spasms reported. Medicated with oxycodone and lidocaine patch. Refused B&O suppository at this time. Educated on the effectiveness of this. Will monitor overnight. Still with large clots from bladder into peri pad. All current hospital precautions regarding PPE use followed including eye protection.  Strict use of mask and safe social distancing when possible used. Patient encouraged to use mask whenever hopital employees in room and in close contact with patient.See flowsheets, patient education and plan of care for additional information.

## 2020-05-19 DIAGNOSIS — E119 Type 2 diabetes mellitus without complications: Secondary | ICD-10-CM

## 2020-05-19 DIAGNOSIS — I2510 Atherosclerotic heart disease of native coronary artery without angina pectoris: Secondary | ICD-10-CM

## 2020-05-19 DIAGNOSIS — Z86711 Personal history of pulmonary embolism: Secondary | ICD-10-CM

## 2020-05-19 DIAGNOSIS — I4891 Unspecified atrial fibrillation: Secondary | ICD-10-CM

## 2020-05-19 DIAGNOSIS — D62 Acute posthemorrhagic anemia: Secondary | ICD-10-CM

## 2020-05-19 DIAGNOSIS — Z7982 Long term (current) use of aspirin: Secondary | ICD-10-CM

## 2020-05-19 DIAGNOSIS — Z20822 Contact with and (suspected) exposure to covid-19: Secondary | ICD-10-CM

## 2020-05-19 DIAGNOSIS — N3041 Irradiation cystitis with hematuria: Secondary | ICD-10-CM

## 2020-05-19 DIAGNOSIS — Z955 Presence of coronary angioplasty implant and graft: Secondary | ICD-10-CM

## 2020-05-19 DIAGNOSIS — Z8673 Personal history of transient ischemic attack (TIA), and cerebral infarction without residual deficits: Secondary | ICD-10-CM

## 2020-05-19 DIAGNOSIS — I10 Essential (primary) hypertension: Secondary | ICD-10-CM

## 2020-05-19 DIAGNOSIS — Z79899 Other long term (current) drug therapy: Secondary | ICD-10-CM

## 2020-05-19 DIAGNOSIS — Z7901 Long term (current) use of anticoagulants: Secondary | ICD-10-CM

## 2020-05-19 DIAGNOSIS — Z95828 Presence of other vascular implants and grafts: Secondary | ICD-10-CM

## 2020-05-19 DIAGNOSIS — Z888 Allergy status to other drugs, medicaments and biological substances status: Secondary | ICD-10-CM

## 2020-05-19 DIAGNOSIS — Z8551 Personal history of malignant neoplasm of bladder: Secondary | ICD-10-CM

## 2020-05-19 DIAGNOSIS — Z923 Personal history of irradiation: Secondary | ICD-10-CM

## 2020-05-19 DIAGNOSIS — Z7984 Long term (current) use of oral hypoglycemic drugs: Secondary | ICD-10-CM

## 2020-05-19 DIAGNOSIS — Z86718 Personal history of other venous thrombosis and embolism: Secondary | ICD-10-CM

## 2020-05-19 DIAGNOSIS — Z882 Allergy status to sulfonamides status: Secondary | ICD-10-CM

## 2020-05-19 LAB — BASIC METABOLIC PANEL
BKR ANION GAP: 14 (ref 7–17)
BKR BLOOD UREA NITROGEN: 16 mg/dL (ref 8–23)
BKR BUN / CREAT RATIO: 13.6 (ref 8.0–23.0)
BKR CALCIUM: 8.6 mg/dL — ABNORMAL LOW (ref 8.8–10.2)
BKR CHLORIDE: 104 mmol/L (ref 98–107)
BKR CO2: 16 mmol/L — ABNORMAL LOW (ref 20–30)
BKR CREATININE: 1.18 mg/dL (ref 0.40–1.30)
BKR EGFR (AFR AMER): >60 mL/min/{1.73_m2} (ref >60)
BKR EGFR (NON AFRICAN AMERICAN): 60 mL/min/{1.73_m2} (ref >60)
BKR GLUCOSE: 118 mg/dL — ABNORMAL HIGH (ref 70–100)
BKR POTASSIUM: 4 mmol/L (ref 3.3–5.3)
BKR SODIUM: 134 mmol/L — ABNORMAL LOW (ref 136–144)

## 2020-05-19 LAB — HEMOGLOBIN AND HEMATOCRIT, BLOOD
BKR WAM HEMATOCRIT (2 DEC): 26.8 % — ABNORMAL LOW (ref 38.50–50.00)
BKR WAM HEMOGLOBIN: 8.4 g/dL — ABNORMAL LOW (ref 13.2–17.1)

## 2020-05-19 MED ORDER — AMOXICILLIN 500 MG CAPSULE
500 mg | ORAL_CAPSULE | Freq: Three times a day (TID) | ORAL | 1 refills | 5.00 days | Status: AC
Start: 2020-05-19 — End: ?

## 2020-05-19 MED ORDER — CIPROFLOXACIN 500 MG TABLET
500 mg | ORAL_TABLET | Freq: Two times a day (BID) | ORAL | 1 refills | 5.00 days | Status: AC
Start: 2020-05-19 — End: ?
  Filled 2020-05-19: qty 10, 5d supply, fill #1
  Filled 2020-05-19: qty 10, 5d supply, fill #0

## 2020-05-19 MED ORDER — HEPARIN, PORCINE (PF) 100 UNIT/ML IN 0.9% SODIUM CHLORIDE IV SYRINGE
100 unit/mL | Status: DC | PRN
Start: 2020-05-19 — End: 2020-05-19

## 2020-05-19 MED ORDER — AMOXICILLIN 500 MG CAPSULE
500 mg | ORAL_CAPSULE | Freq: Three times a day (TID) | ORAL | 1 refills | Status: AC
Start: 2020-05-19 — End: 2020-05-19
  Filled 2020-05-19: qty 15, 5d supply, fill #0
  Filled 2020-05-19: qty 15, 5d supply, fill #1

## 2020-05-19 MED ORDER — OXYCODONE IMMEDIATE RELEASE 5 MG TABLET
5 mg | ORAL_TABLET | ORAL | 1 refills | 1.00 days | Status: AC | PRN
Start: 2020-05-19 — End: ?
  Filled 2020-05-19: qty 5, 1d supply, fill #1
  Filled 2020-05-19: qty 5, 1d supply, fill #0

## 2020-05-19 MED ORDER — CIPROFLOXACIN 500 MG TABLET
500 mg | ORAL_TABLET | Freq: Two times a day (BID) | ORAL | 1 refills | Status: AC
Start: 2020-05-19 — End: 2020-05-19

## 2020-05-19 MED ORDER — FERROUS SULFATE 325 MG (65 MG IRON) TABLET
32565 mg (65 mg iron) | ORAL | Status: DC
Start: 2020-05-19 — End: 2020-05-19

## 2020-05-19 NOTE — Discharge Summary
[facility] Dis[facility] SummaryPatient Data:  Patient Name: Max Snyder  Admit date: 05/15/2020  Age: 80 y.o.  Discharge date: 05/19/2020 DOB: May 13, 1941           Discharge Attending Physician: Michelene Gardener, MD  MRN: [id number]             Discharged Condition: good PCP: G[facility] [place]  Disposition: Home  Admitting Diagnosis: Hematuria [R31.9] Hospital Course:  Max Snyder is a 80 y.o. male, with a past medical history significant for atrial fibrillation (previously on Eliquis, held), CAD s/p stents, recent DVT/pulmonary embolism (s/p IVC filter placement on 12/9), T2DM, HTN, Lyme disease, CVA, EtOH use disorder and Stage 2 NMIBC (prostatic involvement) followed by Dr. Carles Collet and Dr. Vassie Loll s/p TURBTs and radiation therapy complicated by refractory radiation cystitis s/p hyperbaric oxygen, multiple clot evacuations/fulgurations, intravesical therapies (Amicar, Formalin 2%) and now with s/p bilateral nephrostomy tube placement on 04/19/20 with ongoing hematuria from his nephrostomy tubes and bladder, who was admitted to the Urology Service on 05/15/2020, for ongoing hematuria requiring transfusions from outside hospital.On 05/16/2020 patient was transfused 2 units prbc and underwent [place] abdomen and pelvis with and without IV contrast revealing bilateral nephrostomy tubes in place, bilateral ureteral occlusion devices and significant clot burden in his bladder. Interventional Radiology was consulted for possible selective vesical embolization. Hematology was consulted to optimize his anemia.On 12/30, he underwent bilateral vesicular artery embolization with IR. He tolerated the procedure well and was transferred back to the floor. Patient complained of some bladder spasms after the procedure but these were well controlled with Ditropan and B&O suppositories. He reported mild bladder spasms on 05/18/2020, which were controlled with medications. On 05/19/20, the patient was meeting milestones for discharge to home. The patient will follow up with his home Urologist in 2 weeks. The rest of the discharge information has been entered electronically.  Pertinent Procedure/Surgeries: - Procedure(s) (LRB):IR ANGIOGRAM PELVIS SELECTIVE OR SUPRASELECTIVE (N/A)IR US Guidance Needle Placement (N/A)IR Aortagram Abdominal Serialogram (N/A)IR Angiogram Selective 2nd OrderIR Angiogram Upper 3rd OrderIR Vasc Embo Arterial Objective:  PMH PSH Past Medical History: Diagnosis Date ? 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 Bladder cancer (HC Code)  ? Diabetes mellitus (HC Code)   Type 2 ? Hypertension  ? Kidney stone  ? Lyme disease  ? Stroke (HC Code) 2004  No past surgical history on file. Social History Family History Social History Tobacco Use ? Smoking status: Never Smoker ? Smokeless tobacco: Never Used Substance Use Topics ? Alcohol use: Yes  No family history on file. Discharge Vitals:Blood pressure (!) 146/70, pulse (!) 109, temperature 98.1 ?F (36.7 ?C), temperature source Oral, resp. rate 18, height 5' 11 (1.803 m), weight 86.6 kg, SpO2 96 %. Physical Exam:Gen: awake, alert, NAD CV: tachycardic, irregular rhythm Pulm: non-labored breathing on RA Abd: soft, non-tender, left nephrostomy tube draining light pink, right nephrostomy tube draining CYUGU: right groin incision covered with dressing, moderate strikethrough Ext: WWPPertinent Imaging/Lab Tests: See IR procedure note for imaging studies. H/H at discharge - 8.4/26.8 Cr - 1.18Pending Labs and Tests: Pending Lab Results   Order Current Status  Vitamin C Collected (05/18/20 0227)  Factor VIII activity In process  Methylmalonic acid In process  Thrombin time In process  Von Willebrand factor antigen In process  Von Willebrand panel In process  vWF Activity (BH GH LMW Q YH) In process  Discharge Medications: Current Discharge Medication List  START taking these medications  Details amoxicillin (AMOXIL) 500 mg  capsule Take 1 capsule (500 mg total) by mouth every 8 (eight) hours for 5 days.Qty: 15 capsule, Refills: 0Start date: 05/19/2020, End date: 05/24/2020  belladonna alkaloids-opium (B&O SUPPRETTES) 16.2-30 mg suppository Place 1 suppository rectally 2 (two) times daily as needed for bladder spasm for up to 3 days.Qty: 6 suppository, Refills: 0Start date: 05/19/2020, End date: 05/22/2020  belladonna alkaloids-opium (B&O SUPPRETTES) 16.2-60 mg suppository Place 1 suppository rectally daily as needed for up to 10 days.Qty: 10 suppository, Refills: 0Start date: 05/18/2020, End date: 05/28/2020  ciprofloxacin HCl (CIPRO) 500 mg tablet Take 1 tablet (500 mg total) by mouth every 12 (twelve) hours for 5 days.Qty: 10 tablet, Refills: 0Start date: 05/19/2020, End date: 05/24/2020  !! oxyCODONE (ROXICODONE) 5 mg Immediate Release tablet Take 1 tablet (5 mg total) by mouth every 4 (four) hours as needed for up to 5 doses.Qty: 5 tablet, Refills: 0Start date: 05/19/2020   !! - Potential duplicate medications found. Please discuss with provider.  CONTINUE these medications which have NOT CHANGED  Details ALPRAZolam (XANAX) 1 mg tablet Take 1 mg by mouth 3 (three) times daily as needed for anxiety.  amLODIPine (NORVASC) 10 mg tablet Take 10 mg by mouth daily.   atorvastatin (LIPITOR) 80 mg tablet Take 80 mg by mouth at bedtime.   dilTIAZem (CARDIZEM) 30 mg Immediate Release tablet Take 30 mg by mouth 4 (four) times daily.  ferrous sulfate (FEOSOL) 325 mg (65 mg iron) tablet Take 1 tablet by mouth daily.  glimepiride (AMARYL) 2 mg tablet Take 2 mg by mouth every morning.   oxybutynin XL (DITROPAN XL) 15 mg 24 hr tablet Take 15 mg by mouth daily.  !! oxyCODONE (ROXICODONE) 5 mg Immediate Release tablet Take 5 mg by mouth every 6 (six) hours as needed.  pantoprazole (PROTONIX) 40 mg tablet Take 40 mg by mouth daily.   polyethylene glycol (MIRALAX) 17 gram packet Take 17 g by mouth daily. Mix in 8 ounces of water, juice, soda, coffee or tea prior to taking.  senna-docusate (SENNA-PLUS) 8.6-50 mg tablet Take 1 tablet by mouth daily.  tadalafiL (CIALIS) 5 mg tablet Take 5 mg by mouth daily.   trospium (SANCTURA) 20 mg tablet Take 20 mg by mouth 2 (two) times daily.  aspirin 81 mg EC delayed release tablet Take 81 mg by mouth.  losartan (COZAAR) 100 mg tablet    !! - Potential duplicate medications found. Please discuss with provider.  ISSUES TO FOLLOW UP POST-DISCHARGE1.) Follow up with home Urologist at your earliest convenience.  2.) Please contact [facility] with any questions or concerns. Follow-up Information:[facility] [phone] [address] [place] [phone] schedule follow up with home UrologistElectronically Signed:Syed Jonathon Jordan, MD //Trevell Pariseau Normand Sloop, MD1/05/2020 Addendum : [facility]

## 2020-05-19 NOTE — Care Coordination-Inpatient
I identified my role as TC from Care Management department .  Medicare Important Message explained to patient over the phone.  Patient stated understanding the notice and was in agreement with the discharge plan.  Patient advised of the right to call the [employer] [facility]) at [phone] if not in agreement with the discharge from the hospital.  Copy mailed to patient after confirming address.Lauralyn Primes CoordinatorCare Management DepartmentYale Terril HospitalMHB: [phone]

## 2020-05-19 NOTE — Plan of Care
Plan of Care Overview/ Patient Status   Pt A&O x 4. Tachycardic and BP elevated after ambulation. DOE noted with ambulation. Satting appropriately on RA. Afebrile. Denies SOB or CP. Bladder spasm controlled pain regimen. Bilateral neph tubes to gravity, dressing c/d/i. Chest port flushing and (+) BR, dressing c/d/i.  L neph tube putting out red/darker urine than R neph tube. Assist x 1 OOB. Safety precautions in place. Call bell within reach Problem: Adult Inpatient Plan of CareGoal: Plan of Care ReviewOutcome: Interventions implemented as appropriateGoal: Patient-Specific Goal (Individualized)Outcome: Interventions implemented as appropriateGoal: Absence of Hospital-Acquired Illness or InjuryOutcome: Interventions implemented as appropriateGoal: Optimal Comfort and WellbeingOutcome: Interventions implemented as appropriateGoal: Readiness for Transition of CareOutcome: Interventions implemented as appropriate Problem: InfectionGoal: Absence of Infection Signs and SymptomsOutcome: Interventions implemented as appropriate Problem: Fall Injury RiskGoal: Absence of Fall and Fall-Related InjuryOutcome: Interventions implemented as appropriate Problem: Bleeding (Urinary Diversion)Goal: Absence of BleedingOutcome: Interventions implemented as appropriate Problem: Bowel Elimination Impaired (Urinary Diversion)Goal: Effective Bowel EliminationOutcome: Interventions implemented as appropriate Problem: Infection (Urinary Diversion)Goal: Absence of Infection Signs and SymptomsOutcome: Interventions implemented as appropriate Problem: Pain (Urinary Diversion)Goal: Acceptable Pain ControlOutcome: Interventions implemented as appropriate Problem: Urine Elimination Impaired (Urinary Diversion)Goal: Effective Urinary EliminationOutcome: Interventions implemented as appropriate Problem: Pain AcuteGoal: Acceptable Pain Control and Functional AbilityOutcome: Interventions implemented as appropriate

## 2020-05-19 NOTE — Plan of Care
Plan of Care Overview/ Patient Status    A/Ox4. Tachycardic. BL neph tubes flushed per order. Dsg CDI. Port de-accessed. OOB SBA. Safety maintained. Arne Schlender was discharged via Private Car accompanied by Alone.  Verbalized understanding of discharge instructionsand recommended follow up care as per the after visit summary.  Written discharge instructions provided. Denies any further questions. Vital signs    Vitals:  05/19/20 0418 05/19/20 0618 05/19/20 0805 05/19/20 1104 BP: (!) 152/75 124/72 132/63 (!) 146/70 Pulse: (!) 116 (!) 95 (!) 108 (!) 109 Resp: 20  20 18  Temp: 98.6 ?F (37 ?C)  (!) 100 ?F (37.8 ?C) 98.1 ?F (36.7 ?C) TempSrc: Oral  Oral Oral SpO2: 96% 95% 95% 96% Weight:     Height:

## 2020-05-19 NOTE — Progress Notes
Urologic Surgery Progress NoteAttending Provider: Michelene Gardener, MDAdmit Date: 12/28/2021Hospital Day: 4Code status: Full Code/ACLSAllergy: Lisinopril and Sulfa (sulfonamide antibiotics)Background:  Max Snyder is a 80 y.o. male w/ PMH of atrial fibrillation (previously on Eliquis, held), CAD s/p stents, recent DVT/pulmonary embolism (s/p IVC filter placement on 12/9), T2DM, HTN, Lyme disease, CVA, EtOH use disorder and Stage 2 NMIBC (prostatic involvement) followed by Dr. Carles Collet and Dr. Vassie Loll s/p TURBTs and radiation therapy complicated by refractory radiation cystitis s/p hyperbaric oxygen, multiple clot evacuations/fulgurations, intravesical therapies (Amicar, Formalin 2%) and now with s/p bilateral nephrostomy tube placement on 04/19/20 who presented with ongoing hematuria from his nephrostomy tubes and bladder. S/p bilateral vesicular artery embolization with IR on 05/17/2020.  Subjective/Interim Events: POD1 after bilateral vesicular artery embolization with IR.Bladder spasms yesterday afternoon and overnight.Reports continued bladder spasms this morning. AF. HDS. NAEON.Tachycardic overnight. UOP - 1635 (0.78 cc/kg/hr) 	- R NT - 900 cc	- L NT - 735 cc  H/H - 8.0/25.30 (8.6/27.90) Cr - 1.15 (1.30) Objective: Temp:  [97.8 ?F (36.6 ?C)-98.9 ?F (37.2 ?C)] 98.1 ?F (36.7 ?C)Pulse:  [89-120] 111Resp:  [16-20] 18BP: (105-177)/(60-82) 156/80SpO2:  [94 %-97 %] 97 %Device (Oxygen Therapy): room airI/O last 3 completed shifts:In: 120 [P.O.:120]Out: 1500 [Urine:1500] CBCLab Results Component Value Date  WBC 7.2 05/17/2020  HGB 8.0 (L) 05/18/2020  HCT 25.30 (L) 05/18/2020  PLT 196 05/17/2020 ELECTROLYTESLab Results Component Value Date  NA 133 (L) 05/18/2020  K 4.0 05/18/2020  CL 102 05/18/2020  CO2 19 (L) 05/18/2020  CREATININE 1.15 05/18/2020  BUN 18 05/18/2020  GLU 152 (H) 05/18/2020  CALCIUM 8.4 (L) 05/18/2020 PT/INR/PTTLab Results Component Value Date  INR 1.12 05/17/2020  PTT 31.2 05/18/2020 Microbiology:Lab Results Component Value Date  LABURIN 10,000-49,000 CFU/mL Pseudomonas aeruginosa (A) 05/16/2020  LABURIN >=100,000 CFU/mL Viridans Streptococcus (A) 05/16/2020  LABURIN >=100,000 CFU/mL Pseudomonas aeruginosa (A) 05/16/2020  LABURIN >=100,000 CFU/mL Viridans Streptococcus (A) 05/16/2020 PHYSICAL EXAMDr. Murlean Iba Choksi was present as a chaperone for the GU portion of the physical examGen: awake, alert, NADCV: tachycardic Pulm: non-labored breathing on RA Abd: soft, non-tender, left NT draining CYU, right NT draining CYU GU: right groin incision covered with dressingExt: WWP Assessment Max Snyder is a 80 y.o. male, Hospital Day: 4, with PMHx of atrial fibrillation (previously on Eliquis, held), CAD s/p stents, recent DVT/pulmonary embolism (s/p IVC filter placement on 12/9), T2DM, HTN, Lyme disease, CVA, EtOH use disorder and Stage 2 NMIBC (prostatic involvement) followed by Dr. Carles Collet and Dr. Vassie Loll s/p TURBTs and radiation therapy complicated by refractory radiation cystitis s/p hyperbaric oxygen, multiple clot evacuations/fulgurations, intravesical therapies (Amicar, Formalin 2%) and now with s/p bilateral nephrostomy tube placement on 04/19/20 with ongoing hematuria from his nephrostomy tubes and bladder.  [place] Urogram revealed large clot burden in bladder. Patient now s/p embolization of bilateral vesicular arteries with IR on 05/17/2020. Plan Diet: Cardiac carbohydrate consistent Multimodal pain control Abx for positive urine culture: Ceftazidime Bladder discomfort: scheduled oxybutynin, PRN B&O suppositories Incentive SpirometryNasal Oxygen PRNHolding SQH per hematology request OOB, AmbulateMaintain bilateral nephrostomy tubes to gravity drainage LDISSDispo: Floor Signed:Noralyn Karim Redmond Baseman, MDFloor Pager (for primary patients): [phone] Pager: [phone] from 6am-6pm. Amion for nights/weekends.Attending Addendum:

## 2020-05-19 NOTE — Discharge Instructions
You were admitted to the [facility] because of radiation cystitis. While you were here, the Interventional Radiology team embolized the arteries supplying your bladder to help decrease bleeding from the bladder. You also had an infection in your urine for which you received antibiotics. You improved after that. Nephrostomy Tube Care:  Place a dry sterile gauze dressing around drain and change as needed. Empty nephrostomy tubes as needed. Call your doctor if drain output significant decreases, becomes milky or develops a foul odor.Activity:  Light activity is permitted (i.e. walking). Try to be out of bed during the day, except for a short nap or rest period. Eat small frequent meals, especially when taking narcotic pain medicine as it can cause nausea on an empty stomach.You can continue to expect clots coming from your urethral meatus. Avoid:No driving while taking narcotic pain medication or muscle relaxer. Avoid strenuous activity; no pushing, pulling, overhead reaching, or heavy lifting greater than a gallon of milk until cleared by your doctor. Diet: Resume your prior dietMedication:Antibiotics - we have prescribed a total of a 7 day course (including hospital stay) of ciprofloxacin and amoxicillin for you to take. Please take all of the antibiotics pills, even if you are feeling better. Call your doctor ZOX:WRUEAV to keep food down Increase abdominal pain, distention, nausea or vomiting A fever >101.5*FShortness of breath or chest pain that does not resolve with restQuestions or concerns!Follow Up: Please arrange follow up with Dr. Carles Collet or Dr. Sonnie Alamo office.   Patient Education Nephrostomy Care AfterRefer to this sheet in the next few weeks. These instructions provide you with information on caring for yourself after your procedure. Your caregiver may also give you specific instructions. Your treatment has been planned according to current medical practices, but problems sometimes occur. Call your caregiver if you have any problems or questions after your procedure.HOME CARE INSTRUCTIONS?	Avoid taking aspirin or non-steroidal anti-inflammatory drugs (NSAIDs).?	Only take over-the-counter or prescription medicines for pain, discomfort, or fever as directed by your caregiver.?	Take your antibiotics as directed. Finish them even if you start to feel better.?	Rest for the remainder of the day. Do not operate machinery, drive, or make legal decisions for 24 hours after your procedure.?	Have someone drive you home.?	You may resume your usual diet after the procedure. Drink extra fluids while the catheter is in place. Avoid alcoholic beverages for 24 hours after the procedure.?	Keep the skin around your catheter dry.?	You may shower. Cover the area with plastic wrap and tape the edges of the plastic wrap to your skin so your skin remains dry under the plastic. If the area does get wet, dry the skin completely.?	Avoid baths or swimming.SEEK MEDICAL CARE IF: ?	Redness, increased soreness, or swelling develops.?	Your symptoms persist after several days or worsen.?	If you seeblood in the urine (hematuria) for over 48 hours.SEEK IMMEDIATE MEDICAL CARE IF: ?	Your flexible tube (catheter) accidentally gets pulled out.?	You have chills or increased pain.?	You have an oral temperature above 102? F (38.9? C), not controlled by medicine.?	The skin breaks down around the catheter.?	Your catheter stops draining.The catheter may be blocked.?	There is leakage of urine around catheter.Document Released: 12/27/2003 Document Revised: 07/28/2011 Document Reviewed: 02/16/2010ExitCare? Patient Information ?2014 [place], [place].

## 2020-05-19 NOTE — Progress Notes
Urologic Surgery Progress NoteAttending Provider: Michelene Gardener, MDAdmit Date: 12/28/2021Hospital Day: 5Code status: Full Code/ACLSAllergy: Lisinopril and Sulfa (sulfonamide antibiotics)Background:  Majeed Gutridge?is a 80 y.o.?male?w/ PMH of atrial fibrillation (previously on Eliquis, held), CAD s/p stents, recent DVT/pulmonary embolism (s/p IVC filter placement on 12/9), T2DM, HTN, Lyme disease, CVA, EtOH use disorder and Stage 2 NMIBC (prostatic involvement) followed by Dr. Carles Collet and Dr. Vassie Loll s/p TURBTs and radiation therapy complicated by refractory radiation cystitis s/p hyperbaric oxygen, multiple clot evacuations/fulgurations, intravesical therapies (Amicar, Formalin 2%) and now with s/p bilateral nephrostomy tube placement on 04/19/20 who presented with ongoing hematuria from his nephrostomy tubes and bladder. S/p bilateral vesicular artery embolization with IR on 05/17/2020.  Subjective/Interim Events: POD2 after bilateral vesicular artery embolization. Transitioned to PO antibiotics (cipro and amoxicllin) per micro resultsEndorsed some continued bladder discomfort overnight. Denies any CP, shortness of breath, fever, chills, nausea or vomiting overnight. NAEON. AF. HDS. UOP - 1580	R NT - 790 	L NT - 790 H/H - 8.4/26.8 (8.0/25.3) Cr - 1.18 (1.30) Objective: Temp:  [98.3 ?F (36.8 ?C)-99 ?F (37.2 ?C)] 98.6 ?F (37 ?C)Pulse:  [91-116] 95Resp:  [16-20] 20BP: (116-152)/(57-75) 124/72SpO2:  [95 %-97 %] 95 %Device (Oxygen Therapy): room airI/O last 3 completed shifts:In: 2147 [P.O.:720; I.V.:1387; Other:40]Out: 1630 [Urine:1590; Other:40] CBCLab Results Component Value Date  WBC 7.2 05/17/2020  HGB 8.4 (L) 05/19/2020  HCT 26.80 (L) 05/19/2020  PLT 196 05/17/2020 ELECTROLYTESLab Results Component Value Date  NA 134 (L) 05/19/2020  K 4.0 05/19/2020  CL 104 05/19/2020  CO2 16 (L) 05/19/2020 CREATININE 1.18 05/19/2020  BUN 16 05/19/2020  GLU 118 (H) 05/19/2020  CALCIUM 8.6 (L) 05/19/2020 PT/INR/PTTLab Results Component Value Date  INR 1.12 05/17/2020  PTT 31.2 05/18/2020 Microbiology:Lab Results Component Value Date  LABURIN 10,000-49,000 CFU/mL Pseudomonas aeruginosa (A) 05/16/2020  LABURIN >=100,000 CFU/mL Viridans Streptococcus (A) 05/16/2020  LABURIN >=100,000 CFU/mL Pseudomonas aeruginosa (A) 05/16/2020  LABURIN >=100,000 CFU/mL Viridans Streptococcus (A) 05/16/2020 PHYSICAL EXAMDr. Murlean Iba Choksi was present as a chaperone for the GU portion of the physical examGen: awake, alert, NAD CV: tachycardic, irregular rhythm Pulm: non-labored breathing on RA Abd: soft, non-tender, left nephrostomy tube draining light pink, right nephrostomy tube draining CYUGU: right groin incision covered with dressing, moderate strikethrough Ext: WWP Assessment Izaac Donnally is a 80 y.o. male, Hospital Day: 5, with PMHx?of atrial fibrillation (previously on Eliquis, held), CAD s/p stents, recent DVT/pulmonary embolism (s/p IVC filter placement on 12/9), T2DM, HTN, Lyme disease, CVA, EtOH use disorder and Stage 2 NMIBC (prostatic involvement) followed by Dr. Carles Collet and Dr. Vassie Loll s/p TURBTs and radiation therapy complicated by refractory radiation cystitis s/p hyperbaric oxygen, multiple clot evacuations/fulgurations, intravesical therapies (Amicar, Formalin 2%) and now with s/p bilateral nephrostomy tube placement on 04/19/20 with ongoing hematuria from his nephrostomy tubes and bladder.??[place] Urogram revealed large clot burden in bladder. Patient now s/p embolization of bilateral vesicular arteries with IR on 05/17/2020. Plan Diet: Cardiac carbohydrate consistent Multimodal pain control Transitioned to oral antibiotics for UTI: ciprofloxacin and amoxicillin  Bladder discomfort: scheduled oxybutynin, PRN B&O suppositories Incentive SpirometryNasal Oxygen PRNHolding SQH per hematology request OOB, AmbulateMaintain bilateral nephrostomy tubes to gravity drainage LDISSDischarge this morning Dispo: Floor Signed:Shefali Ng Redmond Baseman, MDFloor Pager (for primary patients): [phone] Pager: [phone] from 6am-6pm. Amion for nights/weekends.Attending Addendum:

## 2020-05-21 LAB — VWF ACTIVITY (BH GH LMW Q YH): BKR VWF ACTIVITY: >390 % — ABNORMAL HIGH (ref 58–163)

## 2020-05-21 LAB — THROMBIN TIME: BKR THROMBIN TIME: 15 s — ABNORMAL LOW (ref 16.7–18.9)

## 2020-05-21 LAB — METHYLMALONIC ACID: BKR METHYLMALONIC ACID: 0.38 umol/L (ref 0.00–0.40)

## 2020-05-21 LAB — VON WILLEBRAND FACTOR ANTIGEN
BKR BILIRUBIN TOTAL: 512 % — ABNORMAL HIGH (ref 62–175)
BKR VON WILLEBRAND AG: 512 % — ABNORMAL HIGH (ref 62–175)

## 2020-05-21 LAB — FACTOR VIII ACTIVITY
BKR FACTOR VIII ACTIVITY: 445.7 % — ABNORMAL HIGH (ref 66.0–143.0)
BKR GLUCOSE: 445.7 % — ABNORMAL HIGH (ref 66.0–143.0)

## 2020-05-21 LAB — SPECIAL COAGULATION MD INTERPRETATION (LAB ORDERABLE ONLY) (GH YH)

## 2023-08-26 IMAGING — MR MRI PELVIS W/WO CONTRAST
4 of 6 series · 13 of 48 positions shown · IV contrast (gadavist)
Comparison: PET scan from January [address] scan from 1515.

________________________________________________________________________________________________ 
MRI ABDOMEN W/WO CONTRAST, MRI PELVIS W/WO CONTRAST, 08/26/2023 [DATE]: 
CLINICAL INDICATION: Malignant Neoplasm Of Overlapping Sites Of Bladder , 
history of bladder resection and prostate resection April 2023.
TECHNIQUE: Multiplanar, multiecho position MR images of the abdomen and pelvis 
were performed without and with intravenous enhancement. 9 mL of Gadavist were 
injected intravenously by hand. 1 mL of Gadavist discarded.

[Series 101: survey supine · axial · 15.0mm · 1.76mm/px · z∈[-88,+227]mm · 4 of 17 slices shown]
[im 1/17]
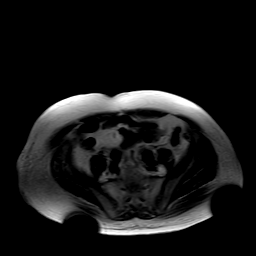
[im 6/17]
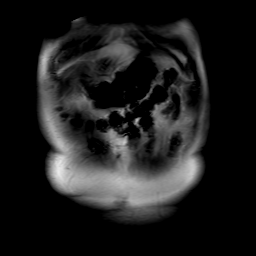
[im 11/17]
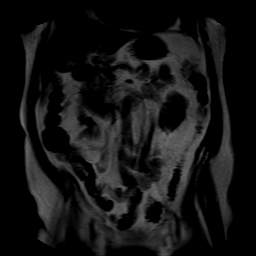
[im 17/17]
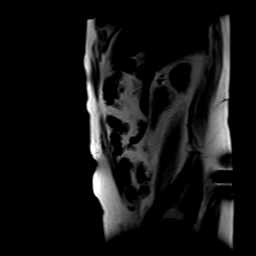

[Series 201: (id) cor bh · coronal · 5.0mm · 0.67mm/px · 3 of 38 slices shown]
[im 5/38]
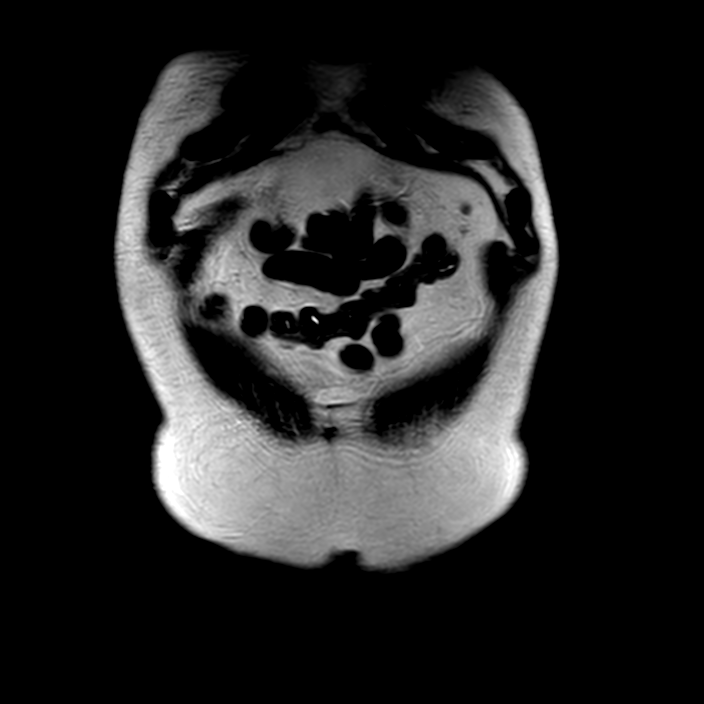
[im 19/38]
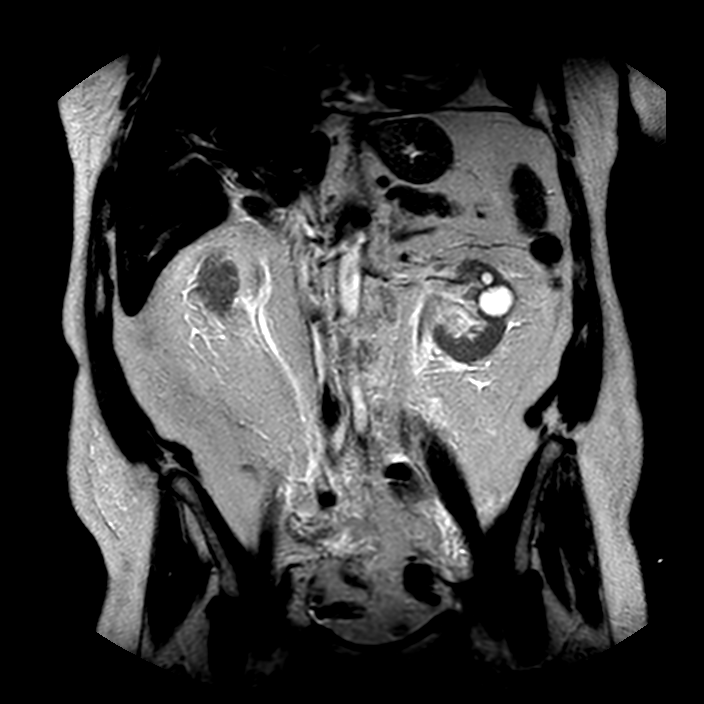
[im 33/38]
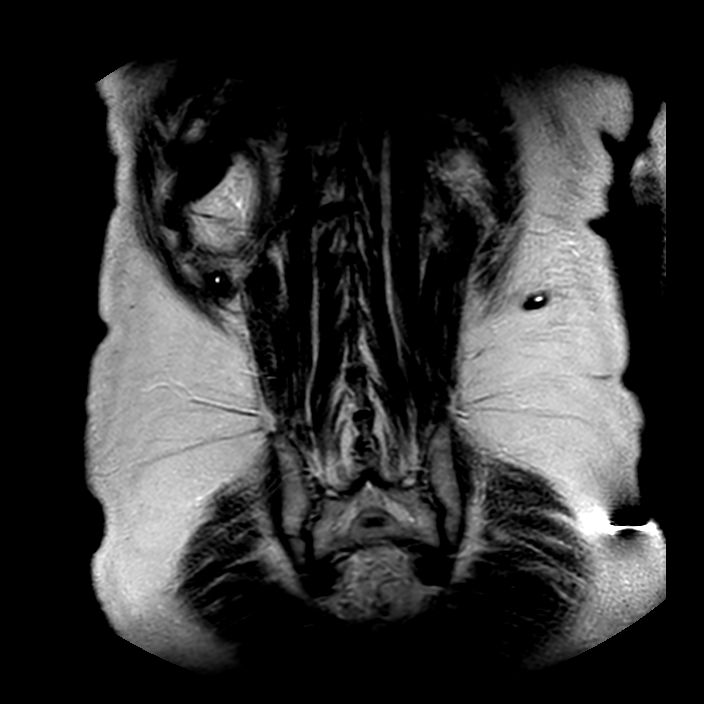

[Series 801: (id)_sag_mvxd_hr_rt · sagittal · 5.5mm · 0.78mm/px · 3 of 46 slices shown]
[im 5/46]
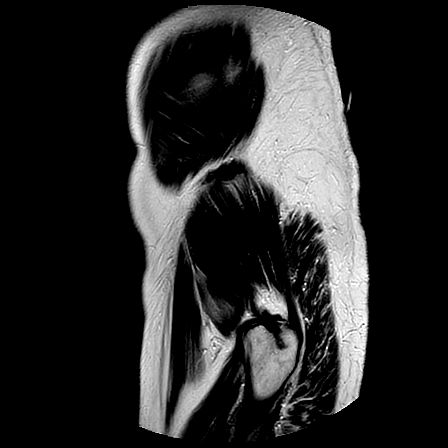
[im 23/46]
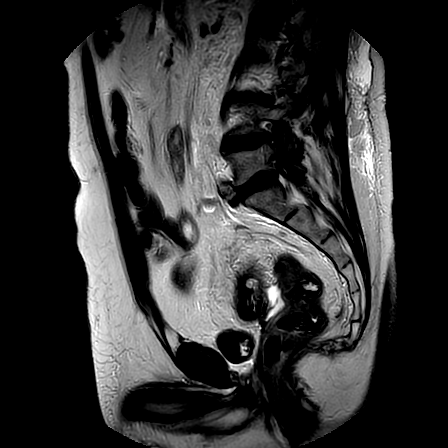
[im 41/46]
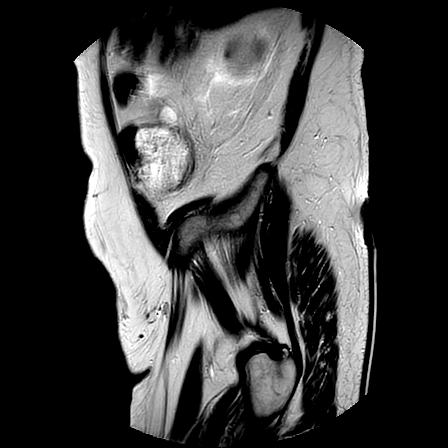

[Series 1101: opt t1_(person_name)_(person_name)_(person_name) · coronal · 6.5mm · 0.52mm/px · 3 of 30 slices shown]
[im 5/30]
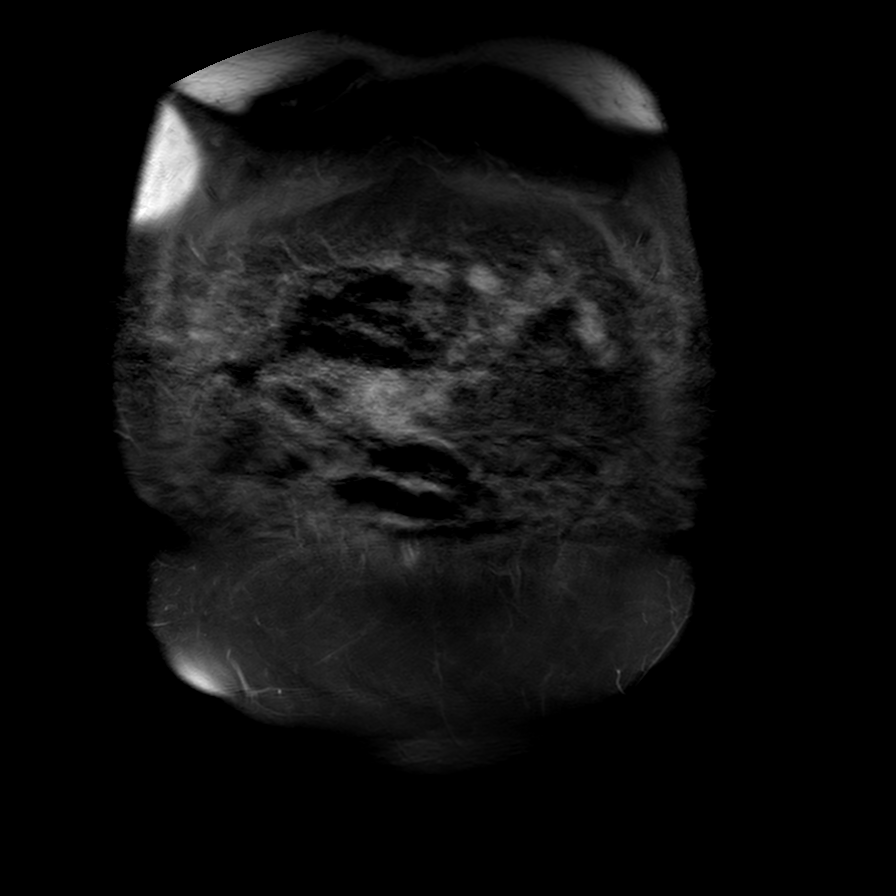
[im 15/30]
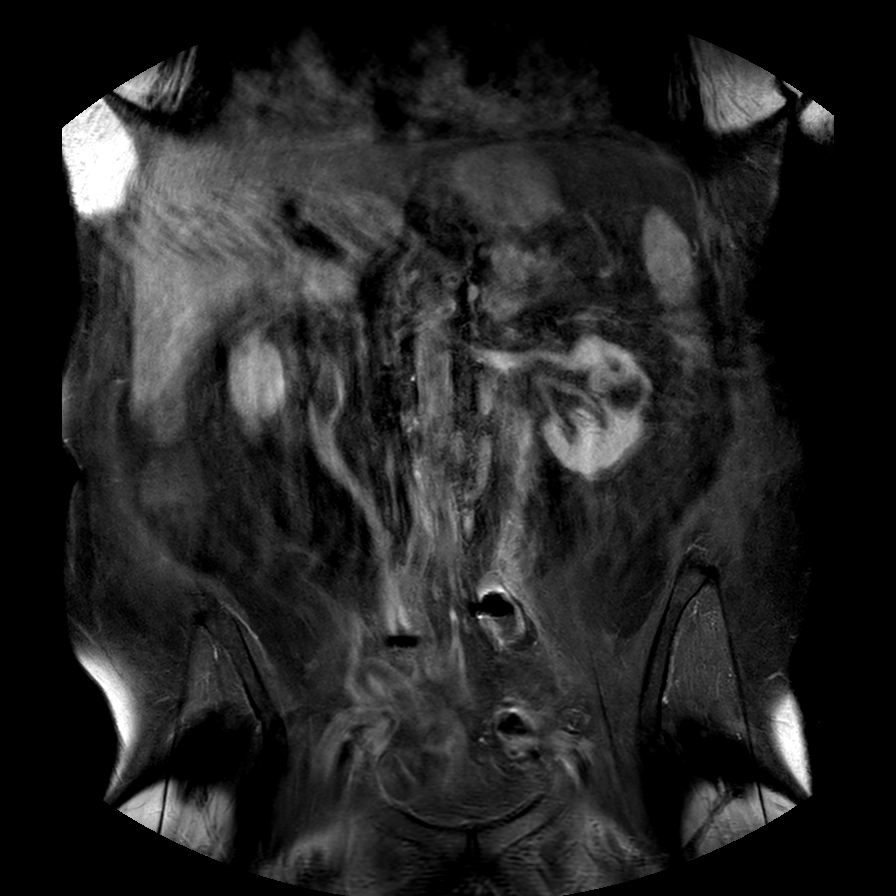
[im 25/30]
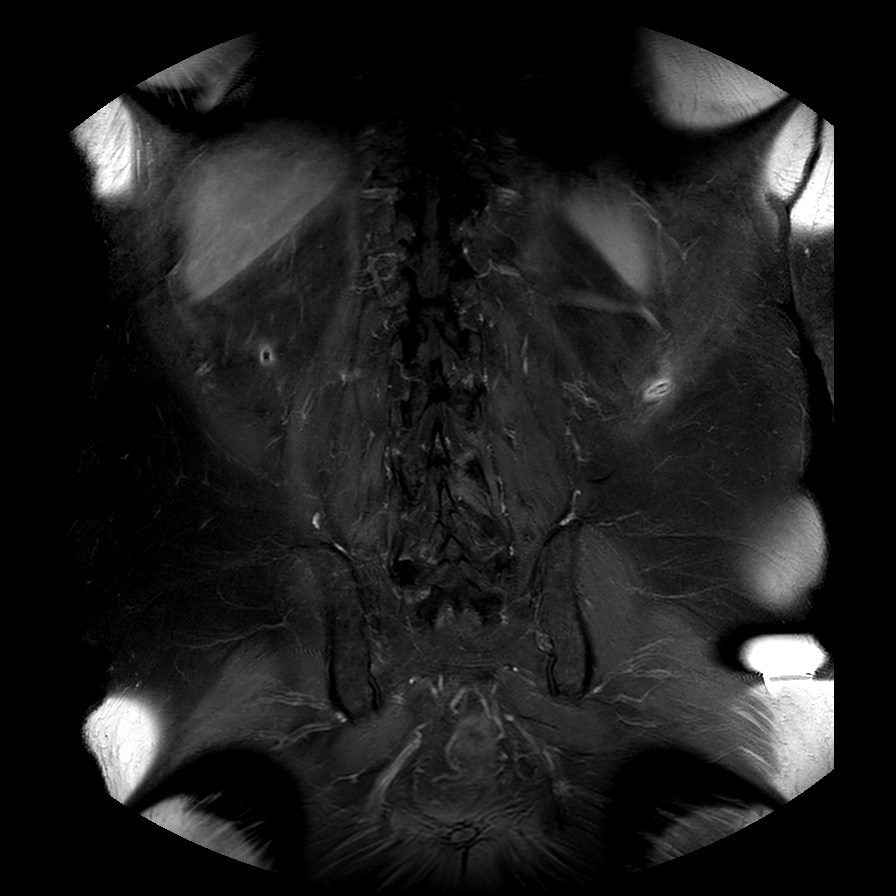

[13 of 48 positions shown; findings below may reference images not displayed]

FINDINGS: The liver is normal in appearance. Small cysts are seen. No mass 
identified. Gallstones are seen within a nondistended gallbladder. Biliary tree 
is within normal limits. 
The pancreas is unremarkable appearance. No mass or cystic changes. No 
pancreatic ductal dilatation. 
The spleen and adrenal glands are normal in appearance. 
There are bilateral nephrostomy tubes in place. Cortical cyst are seen. There is 
fullness of the right collecting system with diffuse mucosal enhancement. The 
ureters are somewhat difficult to follow on MR though there appears to be 
metallic foreign bodies within the distal ureters within the pelvis. Review of 
the prior CT does show metallic foreign bodies within the ureters. 
I do not see lymphadenopathy. No free fluid seen within the abdomen. 
Images extending into the pelvis show previous prostatectomy and cystectomy. 
Trace free fluid. I do not see lymphadenopathy on this examination. 
There is diverticulosis. The surrounding osseous structures are intact.
IMPRESSION: Postoperative changes without evidence to suggest recurrent malignancy on this 
exam. 
Metallic foreign body seen overlying the distal ureters bilaterally. Fullness of 
the right collecting system despite nephrostomy tube in place. Enhancement of 
mucosa thought to more likely be inflammatory. This fullness has developed since 
the PET scan from January 2023. 
Cholelithiasis.

## 2023-08-26 IMAGING — MR MRI ABDOMEN W/WO CONTRAST
14 of 24 series · 24 of 48 positions shown · IV contrast (gadavist)
Comparison: PET scan from January 2023 and CT scan from 1515.

________________________________________________________________________________________________ 
MRI ABDOMEN W/WO CONTRAST, MRI PELVIS W/WO CONTRAST, 08/26/2023 [DATE]: 
CLINICAL INDICATION: Malignant Neoplasm Of Overlapping Sites Of Bladder , 
history of bladder resection and prostate resection April 2023.
TECHNIQUE: Multiplanar, multiecho position MR images of the abdomen and pelvis 
were performed without and with intravenous enhancement. 9 mL of Gadavist were 
injected intravenously by hand. 1 mL of Gadavist discarded.

[Series 101: survey supine · axial · 15.0mm · 1.76mm/px · 1 of 17 slices shown]
[im 1/17]
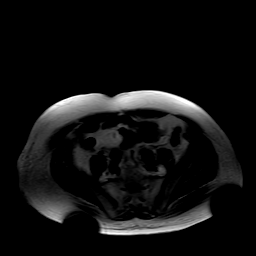

[Series 201: (id) cor bh · coronal · 5.0mm · 0.67mm/px · 1 of 38 slices shown]
[im 1/38]
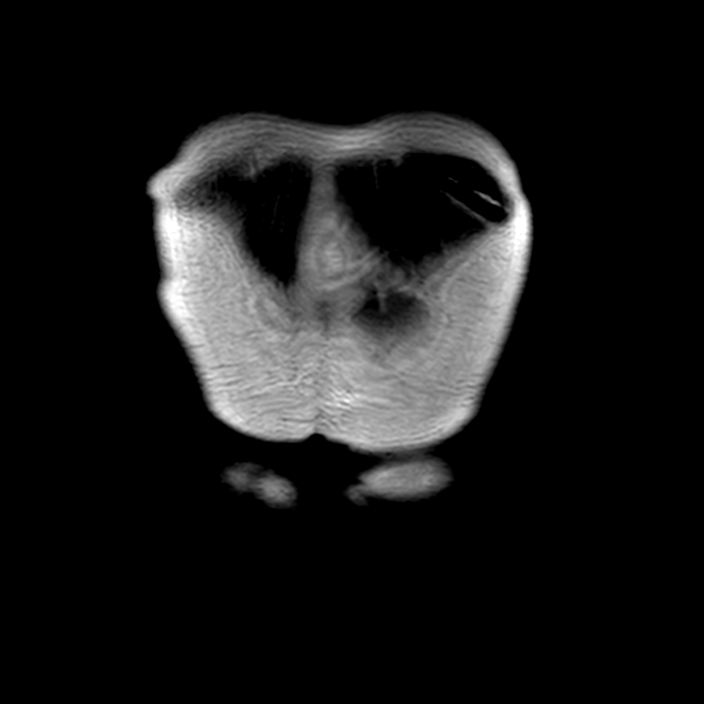

[Series 302: sout of phase · axial · 5.0mm · 1.02mm/px · 1 of 82 slices shown]
[im 1/82]
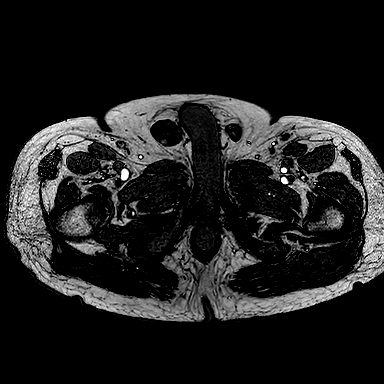

[Series 303: sin phase · axial · 5.0mm · 1.02mm/px · z∈[-235,+239]mm · 2 of 82 slices shown]
[im 1/82]
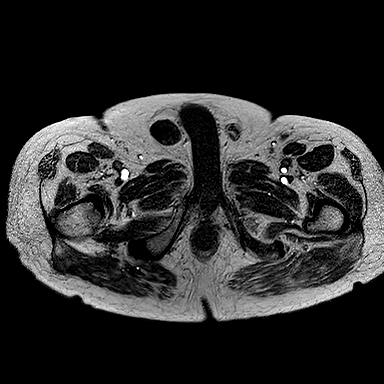
[im 82/82]
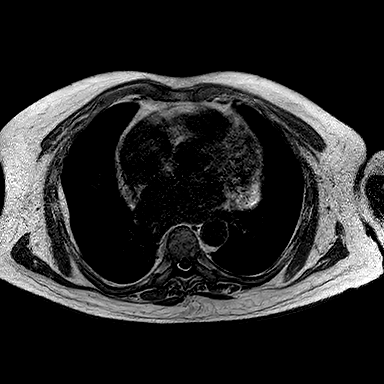

[Series 401: t2_ax_mvxd_hr_rt · axial · 5.0mm · 0.74mm/px · z∈[-242,+244]mm · 2 of 84 slices shown]
[im 1/84]
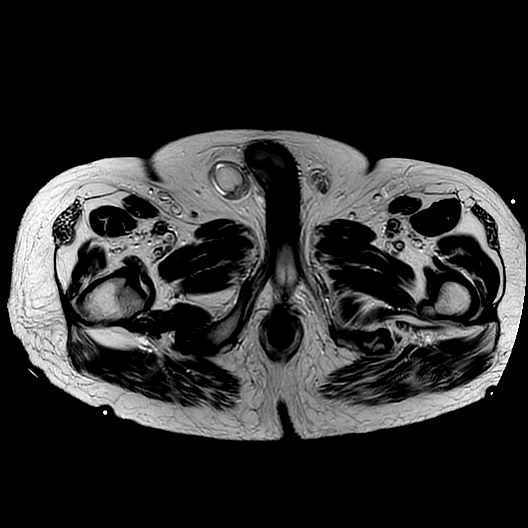
[im 84/84]
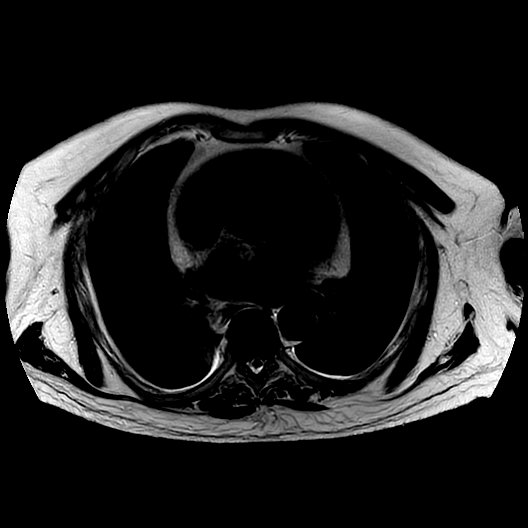

[Series 501: t2_spir_ax_mvxd_rt_fast · axial · 5.0mm · 0.78mm/px · z∈[-242,+244]mm · 2 of 84 slices shown]
[im 1/84]
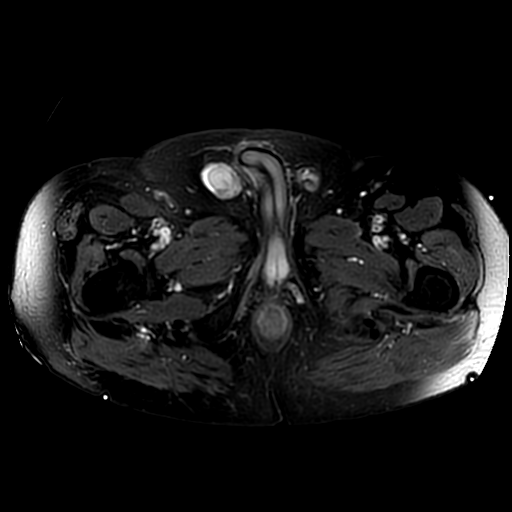
[im 84/84]
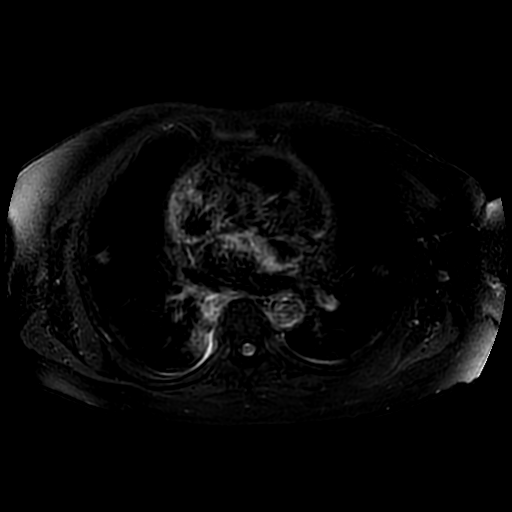

[Series 602: sbo · axial · 5.0mm · 1.74mm/px · 1 of 45 slices shown (1 of 2)]
[im 1/45]
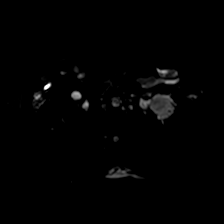

[Series 603: (id) · axial · 5.0mm · 1.74mm/px · 1 of 45 slices shown (1 of 2)]
[im 1/45]
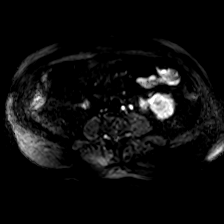

[Series 604: dadc 600 · axial · 5.0mm · 1.74mm/px · 1 of 45 slices shown (1 of 2)]
[im 1/45]
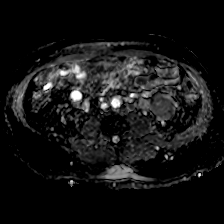

[Series 702: sbo · axial · 5.0mm · 1.74mm/px · 1 of 45 slices shown (2 of 2)]
[im 1/45]
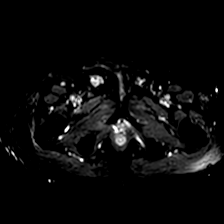

[Series 703: (id) · axial · 5.0mm · 1.74mm/px · 1 of 45 slices shown (2 of 2)]
[im 1/45]
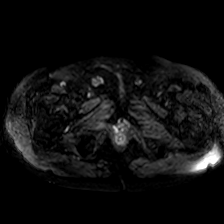

[Series 704: dadc 600 · axial · 5.0mm · 1.74mm/px · 1 of 45 slices shown (2 of 2)]
[im 1/45]
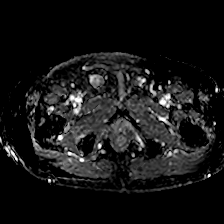

[Series 901: mdixon_dyn_(person_name)_(person_name) · axial · 4.5mm · 0.76mm/px · z∈[-9,+254]mm · 8 of 590 slices shown]
[im 1/590]
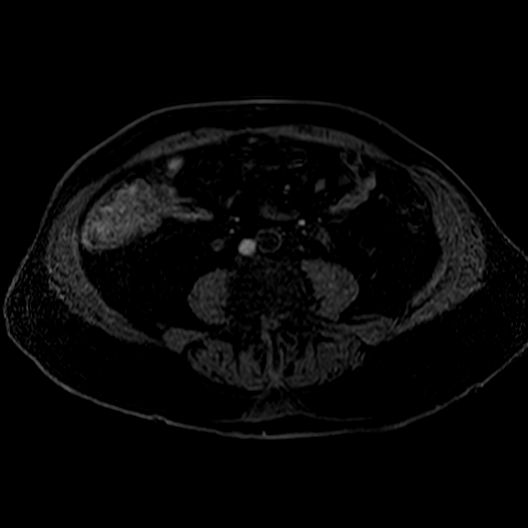
[im 108/590]
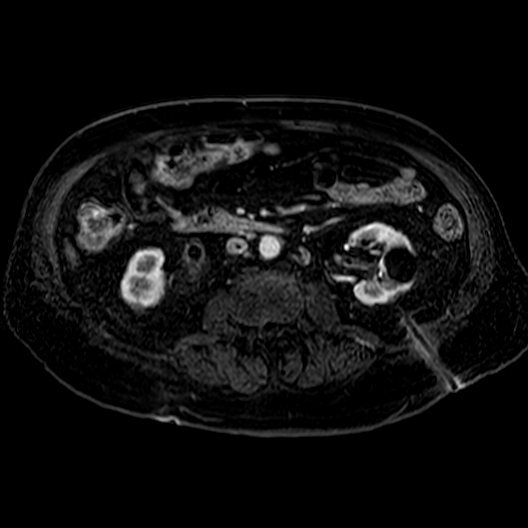
[im 161/590]
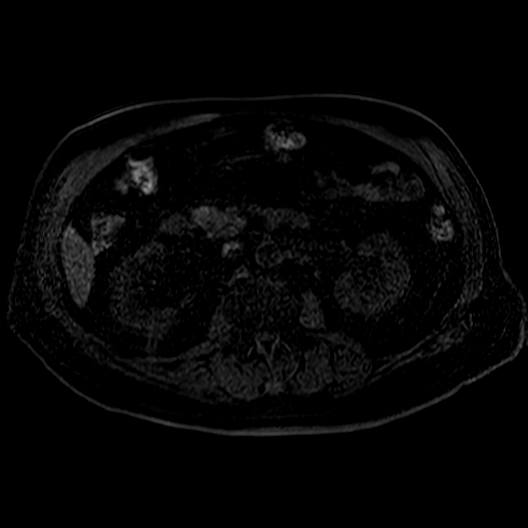
[im 268/590]
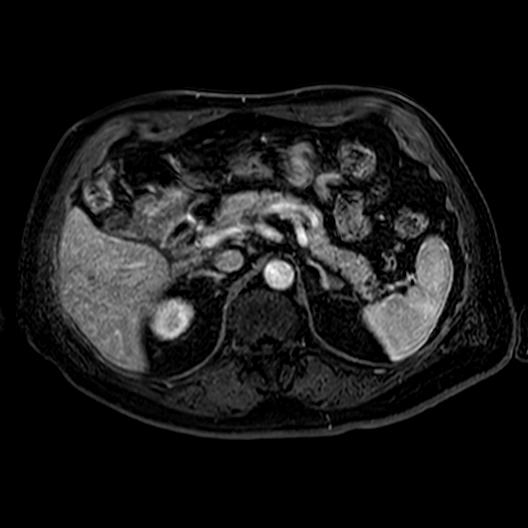
[im 322/590]
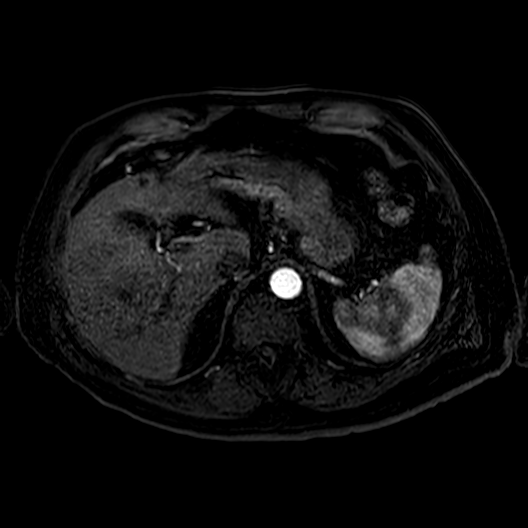
[im 429/590]
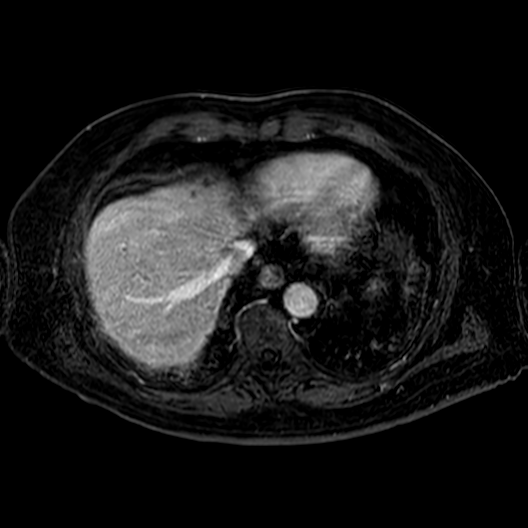
[im 482/590]
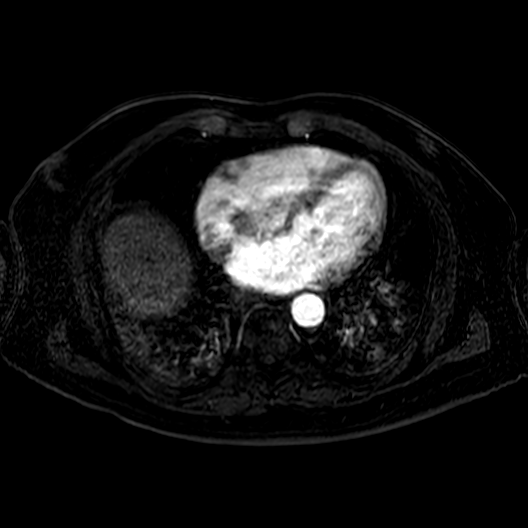
[im 590/590]
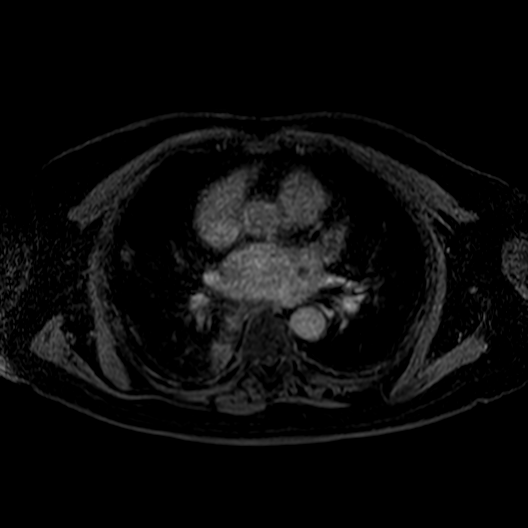

[Series 902: DIXON · axial · 4.5mm · 0.76mm/px · 1 of 118 slices shown]
[im 1/118]
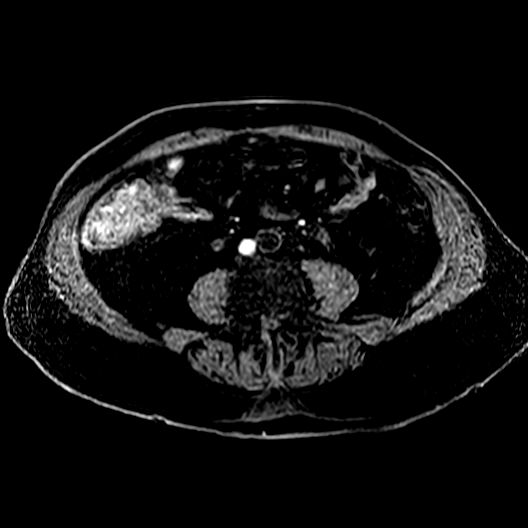

[24 of 48 positions shown; findings below may reference images not displayed]

FINDINGS: The liver is normal in appearance. Small cysts are seen. No mass 
identified. Gallstones are seen within a nondistended gallbladder. Biliary tree 
is within normal limits. 
The pancreas is unremarkable appearance. No mass or cystic changes. No 
pancreatic ductal dilatation. 
The spleen and adrenal glands are normal in appearance. 
There are bilateral nephrostomy tubes in place. Cortical cyst are seen. There is 
fullness of the right collecting system with diffuse mucosal enhancement. The 
ureters are somewhat difficult to follow on MR though there appears to be 
metallic foreign bodies within the distal ureters within the pelvis. Review of 
the prior CT does show metallic foreign bodies within the ureters. 
I do not see lymphadenopathy. No free fluid seen within the abdomen. 
Images extending into the pelvis show previous prostatectomy and cystectomy. 
Trace free fluid. I do not see lymphadenopathy on this examination. 
There is diverticulosis. The surrounding osseous structures are intact.
IMPRESSION: Postoperative changes without evidence to suggest recurrent malignancy on this 
exam. 
Metallic foreign body seen overlying the distal ureters bilaterally. Fullness of 
the right collecting system despite nephrostomy tube in place. Enhancement of 
mucosa thought to more likely be inflammatory. This fullness has developed since 
the PET scan from January 2023. 
Cholelithiasis.
